# Patient Record
Sex: Female | Born: 1950 | Race: White | Hispanic: No | Marital: Single | Smoking: Former smoker
Health system: Southern US, Community
[De-identification: ages and names within clinical notes are randomized; demographics above are authoritative.]

## PROBLEM LIST (undated history)

## (undated) DIAGNOSIS — G709 Myoneural disorder, unspecified: Secondary | ICD-10-CM

## (undated) DIAGNOSIS — M810 Age-related osteoporosis without current pathological fracture: Secondary | ICD-10-CM

## (undated) DIAGNOSIS — M1990 Unspecified osteoarthritis, unspecified site: Secondary | ICD-10-CM

## (undated) DIAGNOSIS — C801 Malignant (primary) neoplasm, unspecified: Secondary | ICD-10-CM

## (undated) DIAGNOSIS — Z5189 Encounter for other specified aftercare: Secondary | ICD-10-CM

## (undated) DIAGNOSIS — T7840XA Allergy, unspecified, initial encounter: Secondary | ICD-10-CM

## (undated) HISTORY — PX: APPENDECTOMY: SHX54

## (undated) HISTORY — PX: BREAST SURGERY: SHX581

## (undated) HISTORY — DX: Unspecified osteoarthritis, unspecified site: M19.90

## (undated) HISTORY — PX: ELBOW SURGERY: SHX618

## (undated) HISTORY — DX: Age-related osteoporosis without current pathological fracture: M81.0

## (undated) HISTORY — DX: Encounter for other specified aftercare: Z51.89

## (undated) HISTORY — DX: Allergy, unspecified, initial encounter: T78.40XA

## (undated) HISTORY — PX: TONSILLECTOMY: SHX5217

## (undated) HISTORY — DX: Myoneural disorder, unspecified: G70.9

---

## 2000-09-24 ENCOUNTER — Other Ambulatory Visit: Admission: RE | Admit: 2000-09-24 | Discharge: 2000-09-24 | Payer: Self-pay | Admitting: Family Medicine

## 2004-05-21 HISTORY — PX: BUNIONECTOMY: SHX129

## 2005-05-02 ENCOUNTER — Encounter: Payer: Self-pay | Admitting: Orthopedic Surgery

## 2005-05-21 ENCOUNTER — Encounter: Payer: Self-pay | Admitting: Orthopedic Surgery

## 2005-06-21 ENCOUNTER — Encounter: Payer: Self-pay | Admitting: Orthopedic Surgery

## 2005-07-19 ENCOUNTER — Encounter: Payer: Self-pay | Admitting: Orthopedic Surgery

## 2006-03-26 ENCOUNTER — Encounter: Payer: Self-pay | Admitting: Orthopedic Surgery

## 2006-04-20 ENCOUNTER — Encounter: Payer: Self-pay | Admitting: Orthopedic Surgery

## 2006-05-30 ENCOUNTER — Encounter: Payer: Self-pay | Admitting: Orthopedic Surgery

## 2006-06-21 ENCOUNTER — Encounter: Payer: Self-pay | Admitting: Orthopedic Surgery

## 2008-02-25 ENCOUNTER — Ambulatory Visit: Payer: Self-pay | Admitting: Family Medicine

## 2008-10-19 HISTORY — PX: MASTECTOMY: SHX3

## 2009-01-20 ENCOUNTER — Ambulatory Visit: Payer: Self-pay | Admitting: Podiatry

## 2009-03-24 ENCOUNTER — Encounter: Payer: Self-pay | Admitting: Family Medicine

## 2009-06-15 ENCOUNTER — Ambulatory Visit: Payer: Self-pay

## 2009-07-27 ENCOUNTER — Ambulatory Visit: Payer: Self-pay | Admitting: General Practice

## 2009-11-19 ENCOUNTER — Emergency Department: Payer: Self-pay | Admitting: Emergency Medicine

## 2011-02-15 ENCOUNTER — Ambulatory Visit: Payer: Self-pay | Admitting: Physical Medicine and Rehabilitation

## 2011-05-17 ENCOUNTER — Encounter: Payer: Self-pay | Admitting: Specialist

## 2011-05-22 ENCOUNTER — Encounter: Payer: Self-pay | Admitting: Specialist

## 2011-06-22 ENCOUNTER — Encounter: Payer: Self-pay | Admitting: Specialist

## 2011-07-20 ENCOUNTER — Encounter: Payer: Self-pay | Admitting: Specialist

## 2012-05-21 HISTORY — PX: KNEE SURGERY: SHX244

## 2012-08-08 LAB — HM DEXA SCAN

## 2013-05-15 ENCOUNTER — Ambulatory Visit: Payer: Self-pay | Admitting: General Practice

## 2013-08-26 ENCOUNTER — Encounter: Payer: Self-pay | Admitting: General Practice

## 2014-04-05 LAB — HM PAP SMEAR: HM Pap smear: NEGATIVE

## 2014-09-03 ENCOUNTER — Ambulatory Visit
Admit: 2014-09-03 | Disposition: A | Discharge status: Home / Self Care | Payer: Self-pay | Attending: Family Medicine | Admitting: Family Medicine

## 2014-09-03 LAB — BASIC METABOLIC PANEL
BUN: 13 mg/dL (ref 4–21)
Creatinine: 0.9 mg/dL (ref 0.5–1.1)
Glucose: 91 mg/dL
POTASSIUM: 4.8 mmol/L (ref 3.4–5.3)
Sodium: 138 mmol/L (ref 137–147)

## 2014-09-03 LAB — CBC AND DIFFERENTIAL
HCT: 38 % (ref 36–46)
HEMOGLOBIN: 12.9 g/dL (ref 12.0–16.0)
Platelets: 299 10*3/uL (ref 150–399)
WBC: 5.8 10*3/mL

## 2014-09-03 LAB — HEPATIC FUNCTION PANEL
ALT: 7 U/L (ref 7–35)
AST: 16 U/L (ref 13–35)

## 2014-09-10 ENCOUNTER — Ambulatory Visit
Admit: 2014-09-10 | Disposition: A | Discharge status: Home / Self Care | Payer: Self-pay | Attending: Family Medicine | Admitting: Family Medicine

## 2014-09-11 NOTE — Op Note (Signed)
PATIENT NAME:  Catherine Arnold, Catherine Arnold MR#:  [id number] DATE OF BIRTH:  [birth year]  DATE OF PROCEDURE:  05/15/2013  PREOPERATIVE DIAGNOSIS: Internal derangement of the right knee.   POSTOPERATIVE DIAGNOSES: Tear of the anterior horn of the medial meniscus, right knee.   PROCEDURE PERFORMED: Right knee arthroscopy and partial medial meniscectomy.   SURGEON: Laurice Record. Hooten, MD  ANESTHESIA: General.   ESTIMATED BLOOD LOSS: Minimal.   TOURNIQUET TIME: Not used.   DRAINS: None.   INDICATIONS FOR SURGERY: The patient is a 64 year old female who has had approximately 1 to 2 year history of intermittent medial right knee pain. Pain was aggravated by pivoting or twisting-type activities. She denies any significant improvement despite conservative measures. 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 After discussion of the risks and benefits of surgical intervention, the patient expressed understanding of the risks and benefits and agreed with plans for surgical intervention.   PROCEDURE IN DETAIL: The patient was brought to the operating room and, after adequate general anesthesia was achieved, a tourniquet was placed on the patient's right thigh and the leg was placed in a leg holder. All bony prominences were well padded. The patient's right knee and leg were cleaned and prepped with alcohol and DuraPrep and draped in the usual sterile fashion. A "timeout" was performed as per usual protocol. The anticipated portal sites were injected with 0.25% Marcaine with epinephrine. An anterolateral portal was created and a cannula was inserted. The scope was inserted and the knee was distended with fluid using the Stryker pump. The scope was advanced down the medial gutter and into the medial compartment of the knee. Under visualization with the scope, an anteromedial portal was created and hook probe was inserted. Inspection of the medial compartment showed the articular surface to be in good condition. Mild  fraying was noted to the inner aspect of the posterior horn of the medial meniscus and this was contoured using the ArthroCare wand. There was a degenerative tear to the anterior horn of the medial meniscus. This was debrided using 4.5 mm shaver and then contoured using a 50 degree ArthroCare wand. The remaining rim of meniscus was visualized and probed and felt to be stable. The scope was then advanced into the intracondylar region. The anterior cruciate ligament was visualized and probed and felt to be stable. The scope was removed from the anterolateral portal and reinserted via the anteromedial portal so as to better visualize the lateral compartment. The articular surface was in good condition. Only mild fraying of the inner aspect of the lateral meniscus was noted and this was debrided using the 50 degree ArthroCare wand. Finally, the scope was positioned so as to visualize the patellofemoral articulation. Good patellar tracking was noted. A small chondral lesion was noted along the medial aspect of the trochlear groove and this was debrided and contoured using the 50 degree ArthroCare wand. The knee was irrigated with copious amounts of fluid and then suctioned dry. The anterolateral portal was reapproximated using #3-0 nylon. A combination of 0.25% Marcaine with epinephrine and 4 mg of morphine was injected via the scope. The scope was removed and the anteromedial portal was reapproximated using #3-0 nylon. Sterile dressing was applied followed by application of ice wrap.   The patient tolerated the procedure well. She was transported to the recovery room in stable condition. ____________________________ Laurice Record. Holley Bouche., MD jph:sb D: 05/15/2013 09:23:42 ET T: 05/15/2013 09:55:31 ET JOB#: [id number]  cc: Laurice Record. Holley Bouche., MD, <Dictator> JAMES P  Holley Bouche MD ELECTRONICALLY SIGNED 05/24/2013 15:57

## 2014-10-21 ENCOUNTER — Other Ambulatory Visit: Payer: Self-pay | Admitting: Family Medicine

## 2014-10-22 ENCOUNTER — Other Ambulatory Visit: Payer: Self-pay | Admitting: Family Medicine

## 2014-10-22 DIAGNOSIS — F419 Anxiety disorder, unspecified: Secondary | ICD-10-CM | POA: Insufficient documentation

## 2014-10-22 MED ORDER — CLONAZEPAM 1 MG PO TABS: 1.0000 mg | ORAL_TABLET | Freq: Every day | ORAL | Status: DC

## 2014-10-26 ENCOUNTER — Other Ambulatory Visit: Payer: Self-pay

## 2014-10-26 DIAGNOSIS — F419 Anxiety disorder, unspecified: Secondary | ICD-10-CM

## 2014-10-26 MED ORDER — CLONAZEPAM 1 MG PO TABS: 1.0000 mg | ORAL_TABLET | Freq: Every day | ORAL | Status: DC

## 2015-02-01 ENCOUNTER — Encounter: Payer: Self-pay | Admitting: Family Medicine

## 2015-02-01 ENCOUNTER — Ambulatory Visit (INDEPENDENT_AMBULATORY_CARE_PROVIDER_SITE_OTHER): Payer: 59 | Admitting: Family Medicine

## 2015-02-01 VITALS — BP 110/72 | HR 76 | Temp 98.9°F | Resp 16 | Wt 111.4 lb

## 2015-02-01 DIAGNOSIS — N952 Postmenopausal atrophic vaginitis: Secondary | ICD-10-CM | POA: Insufficient documentation

## 2015-02-01 DIAGNOSIS — F1011 Alcohol abuse, in remission: Secondary | ICD-10-CM | POA: Insufficient documentation

## 2015-02-01 DIAGNOSIS — N3000 Acute cystitis without hematuria: Secondary | ICD-10-CM | POA: Diagnosis not present

## 2015-02-01 DIAGNOSIS — J309 Allergic rhinitis, unspecified: Secondary | ICD-10-CM | POA: Insufficient documentation

## 2015-02-01 DIAGNOSIS — R55 Syncope and collapse: Secondary | ICD-10-CM | POA: Insufficient documentation

## 2015-02-01 DIAGNOSIS — E559 Vitamin D deficiency, unspecified: Secondary | ICD-10-CM | POA: Insufficient documentation

## 2015-02-01 DIAGNOSIS — Z853 Personal history of malignant neoplasm of breast: Secondary | ICD-10-CM | POA: Insufficient documentation

## 2015-02-01 DIAGNOSIS — M810 Age-related osteoporosis without current pathological fracture: Secondary | ICD-10-CM | POA: Insufficient documentation

## 2015-02-01 DIAGNOSIS — B001 Herpesviral vesicular dermatitis: Secondary | ICD-10-CM | POA: Insufficient documentation

## 2015-02-01 DIAGNOSIS — Z8709 Personal history of other diseases of the respiratory system: Secondary | ICD-10-CM | POA: Insufficient documentation

## 2015-02-01 DIAGNOSIS — M8580 Other specified disorders of bone density and structure, unspecified site: Secondary | ICD-10-CM | POA: Insufficient documentation

## 2015-02-01 LAB — POCT URINALYSIS DIPSTICK
Bilirubin, UA: NEGATIVE
Glucose, UA: NEGATIVE
Ketones, UA: NEGATIVE
Nitrite, UA: NEGATIVE
PH UA: 6.5
SPEC GRAV UA: 1.01
Urobilinogen, UA: 0.2

## 2015-02-01 MED ORDER — PHENAZOPYRIDINE HCL 100 MG PO TABS: 100.0000 mg | ORAL_TABLET | Freq: Three times a day (TID) | ORAL | Status: DC | PRN

## 2015-02-01 MED ORDER — NITROFURANTOIN MONOHYD MACRO 100 MG PO CAPS: 100.0000 mg | ORAL_CAPSULE | Freq: Two times a day (BID) | ORAL | Status: DC

## 2015-02-01 NOTE — Progress Notes (Signed)
Patient ID: Catherine Arnold, female   DOB: 07/28/1950, 64 y.o.   MRN: [id number]       Patient: Catherine Arnold Female    DOB: 1950-06-19   64 y.o.   MRN: [id number] Visit Date: 02/01/2015  Today's Provider: Margarita Rana, MD   Chief Complaint  Patient presents with  . Urinary Tract Infection    started on Sunday, stared with a headace and fatigue, also having some burling sensations, did feel a little dizzy last night at work" pt stated   Subjective:    Urinary Tract Infection  This is a recurrent problem. The current episode started in the past 7 days. The problem occurs every urination. The problem has been gradually worsening. The quality of the pain is described as burning. The pain is at a severity of 3/10. The pain is moderate. There has been no fever (low grade). There is no history of pyelonephritis. Associated symptoms include a discharge, frequency, hesitancy and nausea. Pertinent negatives include no chills, flank pain, hematuria, sweats, urgency or vomiting. Associated symptoms comments: wheezy. Treatments tried: ibuprofen and one day yeast infection tx.       Allergies  Allergen Reactions  . Bee Venom Anaphylaxis  . Paclitaxel Shortness Of Breath and Anaphylaxis    Other reaction(s): ANAPHYLAXIS Other reaction(s): SHORTNESS OF BREATH  . Prochlorperazine Maleate Other (See Comments)    Other reaction(s): Unknown reaction  . Almond Oil     Lost voice for 3 to 4 hours   . Oxycodone-Acetaminophen     Other reaction(s): NAUSEA  . Sulfa Antibiotics    Previous Medications   ACYCLOVIR (ZOVIRAX) 400 MG TABLET    Take by mouth.   CITALOPRAM (CELEXA) 20 MG TABLET    Take 1 and 1/2 tablets by  mouth daily   CLONAZEPAM (KLONOPIN) 1 MG TABLET    Take 1 tablet (1 mg total) by mouth at bedtime.   FLUTICASONE (FLONASE) 50 MCG/ACT NASAL SPRAY    Place into the nose.    Review of Systems  Constitutional: Negative for chills.  Gastrointestinal: Positive for nausea. Negative for  vomiting.  Genitourinary: Positive for hesitancy, frequency and vaginal discharge. Negative for urgency, hematuria and flank pain.    Social History  Substance Use Topics  . Smoking status: Former Smoker    Quit date: 01/20/1992  . Smokeless tobacco: Not on file  . Alcohol Use: No   Objective:   BP 110/72 mmHg  Pulse 76  Temp(Src) 98.9 F (37.2 C) (Oral)  Resp 16  Wt 111 lb 6.4 oz (50.531 kg)  LMP  (Approximate)  Physical Exam  Constitutional: She appears well-developed and well-nourished. No distress.  Cardiovascular: Normal rate.   Pulmonary/Chest: Breath sounds normal.  Abdominal: Soft. She exhibits no distension and no mass. There is no tenderness. There is no rebound, no guarding and no CVA tenderness.  Psychiatric: She has a normal mood and affect. Her speech is normal and behavior is normal. Judgment and thought content normal. Cognition and memory are normal.      Assessment & Plan:        1. Acute cystitis without hematuria Condition is worsening. Will start medication for better control.   Patient instructed to call back if condition worsens or does not improve.    - Urine culture - POCT urinalysis dipstick - nitrofurantoin, macrocrystal-monohydrate, (MACROBID) 100 MG capsule; Take 1 capsule (100 mg total) by mouth 2 (two) times daily.  Dispense: 10 capsule; Refill: 0 - phenazopyridine (PYRIDIUM) 100  MG tablet; Take 1 tablet (100 mg total) by mouth 3 (three) times daily as needed for pain.  Dispense: 10 tablet; Refill: 0    Margarita Rana, MD  [facility]

## 2015-02-03 ENCOUNTER — Telehealth: Payer: Self-pay | Admitting: Family Medicine

## 2015-02-03 LAB — URINE CULTURE

## 2015-02-03 MED ORDER — FLUCONAZOLE 150 MG PO TABS: 150.0000 mg | ORAL_TABLET | Freq: Once | ORAL | Status: DC

## 2015-02-03 NOTE — Telephone Encounter (Signed)
Pt called saying she is not feeling any better, actually feels worse.  No fever but extremely tired.  Still having UTI symptoms.  Would like a call back .  Call back number is  [phone]  [facility]

## 2015-02-03 NOTE — Telephone Encounter (Signed)
Spoke with Izora Gala she already has pyridium.  She would like to try Diflucan and see if that helps with the irritation.  Please send to [facility].  I advised her if that doesn't help to call us tomorrow and we will work her in.    Thanks,   -Mickel Baas

## 2015-02-03 NOTE — Telephone Encounter (Signed)
Please triaged. Can call in bladder numbing medication, and also does patient think it is yeast related. Thanks.

## 2015-02-03 NOTE — Telephone Encounter (Signed)
See note below. Please let patient know that culture came back with very mild infection and was sensitive to antibiotic. So would recommend numbing med and yeast medication or ov to recheck tomorrow. Thanks.

## 2015-02-03 NOTE — Telephone Encounter (Signed)
Sent in rx.  Thanks.

## 2015-02-04 ENCOUNTER — Telehealth: Payer: Self-pay

## 2015-02-04 ENCOUNTER — Ambulatory Visit: Payer: 59 | Admitting: Family Medicine

## 2015-02-04 ENCOUNTER — Encounter: Payer: Self-pay | Admitting: Family Medicine

## 2015-02-04 NOTE — Telephone Encounter (Signed)
Patient cancelled appointment. Feeling better.  Had syncopal episode. Has had previously.  Was dehydrated. Is feeling better.  Will call next week if not better. Is not drinking. Thanks.

## 2015-02-04 NOTE — Telephone Encounter (Signed)
Pt called because she "passed out" this morning. Denies loss of consciousness. Reports she broke out in a "cold sweat", hand became numb, had experienced N/V. EMS was called, and performed vitals, BS, and EKG, which all "were fine" per pt. EMS advised pt this is most likely dehydration. She is still feeling queasy and weak. Pt is concerned that her vaginal infection may have been misdiagnosed, or she is having adverse reaction to abx. Appointment has been made for 12:00 today. [name], CMA

## 2015-03-15 ENCOUNTER — Other Ambulatory Visit: Payer: Self-pay | Admitting: Family Medicine

## 2015-03-15 DIAGNOSIS — J302 Other seasonal allergic rhinitis: Secondary | ICD-10-CM

## 2015-03-26 ENCOUNTER — Ambulatory Visit (INDEPENDENT_AMBULATORY_CARE_PROVIDER_SITE_OTHER): Payer: 59

## 2015-03-26 DIAGNOSIS — Z23 Encounter for immunization: Secondary | ICD-10-CM

## 2015-06-18 ENCOUNTER — Other Ambulatory Visit: Payer: Self-pay | Admitting: Family Medicine

## 2015-06-18 DIAGNOSIS — B001 Herpesviral vesicular dermatitis: Secondary | ICD-10-CM

## 2015-07-25 ENCOUNTER — Telehealth: Payer: Self-pay

## 2015-07-25 ENCOUNTER — Ambulatory Visit: Payer: 59 | Admitting: Family Medicine

## 2015-07-25 NOTE — Telephone Encounter (Signed)
Pt called c/o red, itchy rash on bilateral chest, as well as left arm. Pt denies pain, pustules. States she was out playing golf, and does have sensitivity to sun secondary to radiation. Pt has appointment today at 4:00 pm. [name], CMA

## 2015-07-28 ENCOUNTER — Ambulatory Visit: Payer: 59 | Admitting: Family Medicine

## 2015-08-04 ENCOUNTER — Other Ambulatory Visit: Payer: Self-pay

## 2015-08-04 DIAGNOSIS — F419 Anxiety disorder, unspecified: Secondary | ICD-10-CM

## 2015-08-04 MED ORDER — CLONAZEPAM 1 MG PO TABS: 1.0000 mg | ORAL_TABLET | Freq: Every day | ORAL | Status: DC

## 2015-08-04 NOTE — Telephone Encounter (Signed)
Printed, please fax or call in to pharmacy. Thank you.   

## 2015-08-05 ENCOUNTER — Ambulatory Visit (INDEPENDENT_AMBULATORY_CARE_PROVIDER_SITE_OTHER): Payer: 59 | Admitting: Family Medicine

## 2015-08-05 ENCOUNTER — Encounter: Payer: Self-pay | Admitting: Family Medicine

## 2015-08-05 VITALS — BP 100/64 | HR 76 | Temp 98.2°F | Resp 16 | Wt 114.4 lb

## 2015-08-05 DIAGNOSIS — J069 Acute upper respiratory infection, unspecified: Secondary | ICD-10-CM | POA: Diagnosis not present

## 2015-08-05 LAB — POCT RAPID STREP A (OFFICE): Rapid Strep A Screen: NEGATIVE

## 2015-08-05 NOTE — Progress Notes (Signed)
Subjective:     Patient ID: Catherine Arnold, female   DOB: 06-Jul-1950, 65 y.o.   MRN: [id number]  HPI  Chief Complaint  Patient presents with  . Sore Throat    Patient comes into office today with concerns of strep exposure, patient states that multiple coworkers at her job have had strep  and she has had sore throat for the past 3 days. Patient reports pain when swallowing, swollen glands and post nasal srop. Patient has taken otc Ibuprofen and Robitussin  Also reports sinus congestion.   Review of Systems     Objective:   Physical Exam  Constitutional: She appears well-developed and well-nourished. No distress.  Ears: T.M's intact without inflammation Throat: tonsils absent, mild erythema Neck: small tender anterior cervical nodes Lungs: clear     Assessment:    1. Upper respiratory infection - POCT rapid strep A    Plan:    discussed otc medication.

## 2015-08-05 NOTE — Patient Instructions (Signed)
Discussed use of Mucinex D for congestion, Delsym for cough, and Benadryl for postnasal drainage 

## 2015-08-20 ENCOUNTER — Other Ambulatory Visit: Payer: Self-pay | Admitting: Family Medicine

## 2015-10-30 ENCOUNTER — Other Ambulatory Visit: Payer: Self-pay | Admitting: Family Medicine

## 2015-12-04 ENCOUNTER — Other Ambulatory Visit: Payer: Self-pay | Admitting: Family Medicine

## 2015-12-05 ENCOUNTER — Other Ambulatory Visit: Payer: Self-pay | Admitting: Physician Assistant

## 2015-12-05 DIAGNOSIS — F419 Anxiety disorder, unspecified: Secondary | ICD-10-CM

## 2015-12-05 MED ORDER — CLONAZEPAM 1 MG PO TABS: 1.0000 mg | ORAL_TABLET | Freq: Every day | ORAL | Status: DC

## 2015-12-05 NOTE — Progress Notes (Signed)
Please call in/fax clonazepam 1mg  tab Take one tab PO q h.s. #90 1 refill to [facility]. Thanks.

## 2015-12-05 NOTE — Progress Notes (Signed)
Prescription called in. Per pharmacist is going to put it on hold since patient just pick up her last refill today.  Thanks,  -JER

## 2015-12-27 ENCOUNTER — Other Ambulatory Visit: Payer: Self-pay

## 2015-12-27 MED ORDER — FLUCONAZOLE 150 MG PO TABS: 150.0000 mg | ORAL_TABLET | Freq: Once | ORAL | 1 refills | Status: AC

## 2015-12-27 NOTE — Telephone Encounter (Signed)
Request came in via Email.  Pt uses this for for "Yeast infections that I have frequently as result of breast cancer chemotherapy side effect.    Thanks,   -Mickel Baas

## 2016-01-02 ENCOUNTER — Other Ambulatory Visit: Payer: Self-pay | Admitting: Family Medicine

## 2016-01-26 ENCOUNTER — Other Ambulatory Visit: Payer: Self-pay | Admitting: Family Medicine

## 2016-01-26 DIAGNOSIS — F419 Anxiety disorder, unspecified: Secondary | ICD-10-CM

## 2016-02-27 ENCOUNTER — Other Ambulatory Visit: Payer: Self-pay | Admitting: Family Medicine

## 2016-02-27 DIAGNOSIS — J302 Other seasonal allergic rhinitis: Secondary | ICD-10-CM

## 2016-06-26 ENCOUNTER — Telehealth: Payer: Self-pay

## 2016-06-26 DIAGNOSIS — F419 Anxiety disorder, unspecified: Secondary | ICD-10-CM

## 2016-06-26 MED ORDER — CLONAZEPAM 1 MG PO TABS: 1.0000 mg | ORAL_TABLET | Freq: Every day | ORAL | 1 refills | Status: DC

## 2016-06-26 NOTE — Telephone Encounter (Signed)
Patient requesting refill on clonazepam  Last ov 08/05/15 Last filled 12/05/15 Please review. Thank you. sd

## 2016-06-26 NOTE — Telephone Encounter (Signed)
Rx printed to be sent to [facility]

## 2016-06-29 ENCOUNTER — Other Ambulatory Visit: Payer: Self-pay | Admitting: Physician Assistant

## 2016-06-29 DIAGNOSIS — J302 Other seasonal allergic rhinitis: Secondary | ICD-10-CM

## 2016-06-29 DIAGNOSIS — F339 Major depressive disorder, recurrent, unspecified: Secondary | ICD-10-CM

## 2016-10-12 ENCOUNTER — Other Ambulatory Visit: Payer: Self-pay | Admitting: Physician Assistant

## 2016-10-12 ENCOUNTER — Other Ambulatory Visit: Payer: Self-pay | Admitting: Family Medicine

## 2016-10-12 DIAGNOSIS — F419 Anxiety disorder, unspecified: Secondary | ICD-10-CM

## 2016-10-12 NOTE — Telephone Encounter (Signed)
[facility] faxed a request on the following medication. Thanks CC   clonazePAM (KLONOPIN) 1 MG tablet

## 2016-10-16 NOTE — Telephone Encounter (Signed)
Please review. Emily Drozdowski, CMA  

## 2016-10-17 ENCOUNTER — Telehealth: Payer: Self-pay | Admitting: Physician Assistant

## 2016-10-17 DIAGNOSIS — F419 Anxiety disorder, unspecified: Secondary | ICD-10-CM

## 2016-10-17 MED ORDER — CLONAZEPAM 1 MG PO TABS: 1.0000 mg | ORAL_TABLET | Freq: Every day | ORAL | 1 refills | Status: DC

## 2016-10-17 NOTE — Telephone Encounter (Signed)
Rx printed to be faxed to [facility]

## 2016-10-17 NOTE — Telephone Encounter (Signed)
[facility] faxed a request on the following medication. Thanks CC  clonazePAM (KLONOPIN) 1 MG tablet

## 2016-10-17 NOTE — Telephone Encounter (Signed)
Please review. Thanks!  

## 2017-03-06 ENCOUNTER — Encounter: Payer: Self-pay | Admitting: Physician Assistant

## 2017-03-07 ENCOUNTER — Encounter: Payer: Self-pay | Admitting: Family Medicine

## 2017-03-07 ENCOUNTER — Ambulatory Visit (INDEPENDENT_AMBULATORY_CARE_PROVIDER_SITE_OTHER): Payer: 59 | Admitting: Family Medicine

## 2017-03-07 VITALS — BP 98/54 | HR 72 | Temp 97.5°F | Resp 16 | Ht 66.0 in | Wt 111.0 lb

## 2017-03-07 DIAGNOSIS — F419 Anxiety disorder, unspecified: Secondary | ICD-10-CM

## 2017-03-07 DIAGNOSIS — Z114 Encounter for screening for human immunodeficiency virus [HIV]: Secondary | ICD-10-CM

## 2017-03-07 DIAGNOSIS — Z1159 Encounter for screening for other viral diseases: Secondary | ICD-10-CM

## 2017-03-07 DIAGNOSIS — Z0000 Encounter for general adult medical examination without abnormal findings: Secondary | ICD-10-CM

## 2017-03-07 DIAGNOSIS — M8580 Other specified disorders of bone density and structure, unspecified site: Secondary | ICD-10-CM

## 2017-03-07 DIAGNOSIS — Z853 Personal history of malignant neoplasm of breast: Secondary | ICD-10-CM

## 2017-03-07 DIAGNOSIS — E559 Vitamin D deficiency, unspecified: Secondary | ICD-10-CM

## 2017-03-07 DIAGNOSIS — Z23 Encounter for immunization: Secondary | ICD-10-CM

## 2017-03-07 DIAGNOSIS — Z1211 Encounter for screening for malignant neoplasm of colon: Secondary | ICD-10-CM | POA: Diagnosis not present

## 2017-03-07 NOTE — Assessment & Plan Note (Signed)
Recheck vitamin D level Advice daily calcium and vitamin D supplementation for osteopenia

## 2017-03-07 NOTE — Assessment & Plan Note (Signed)
Stable on celexa 30mg  daily and klonopin qhs prn Could consider remeron if desires weight gain and improvement in appetite

## 2017-03-07 NOTE — Assessment & Plan Note (Signed)
Recheck DEXA scan Advised daily calcium and vitamin D supplementation Given history of chemotherapy and endocrine therapy for breast cancer, would consider endocrine referral for treatment options if this has progressed

## 2017-03-07 NOTE — Patient Instructions (Signed)
Preventive Care 65 Years and Older, Female Preventive care refers to lifestyle choices and visits with your health care provider that can promote health and wellness. What does preventive care include?  A yearly physical exam. This is also called an annual well check.  Dental exams once or twice a year.  Routine eye exams. Ask your health care provider how often you should have your eyes checked.  Personal lifestyle choices, including: ? Daily care of your teeth and gums. ? Regular physical activity. ? Eating a healthy diet. ? Avoiding tobacco and drug use. ? Limiting alcohol use. ? Practicing safe sex. ? Taking low-dose aspirin every day. ? Taking vitamin and mineral supplements as recommended by your health care provider. What happens during an annual well check? The services and screenings done by your health care provider during your annual well check will depend on your age, overall health, lifestyle risk factors, and family history of disease. Counseling Your health care provider may ask you questions about your:  Alcohol use.  Tobacco use.  Drug use.  Emotional well-being.  Home and relationship well-being.  Sexual activity.  Eating habits.  History of falls.  Memory and ability to understand (cognition).  Work and work environment.  Reproductive health.  Screening You may have the following tests or measurements:  Height, weight, and BMI.  Blood pressure.  Lipid and cholesterol levels. These may be checked every 5 years, or more frequently if you are over 50 years old.  Skin check.  Lung cancer screening. You may have this screening every year starting at age 55 if you have a 30-pack-year history of smoking and currently smoke or have quit within the past 15 years.  Fecal occult blood test (FOBT) of the stool. You may have this test every year starting at age 50.  Flexible sigmoidoscopy or colonoscopy. You may have a sigmoidoscopy every 5 years or  a colonoscopy every 10 years starting at age 50.  Hepatitis C blood test.  Hepatitis B blood test.  Sexually transmitted disease (STD) testing.  Diabetes screening. This is done by checking your blood sugar (glucose) after you have not eaten for a while (fasting). You may have this done every 1-3 years.  Bone density scan. This is done to screen for osteoporosis. You may have this done starting at age 65.  Mammogram. This may be done every 1-2 years. Talk to your health care provider about how often you should have regular mammograms.  Talk with your health care provider about your test results, treatment options, and if necessary, the need for more tests. Vaccines Your health care provider may recommend certain vaccines, such as:  Influenza vaccine. This is recommended every year.  Tetanus, diphtheria, and acellular pertussis (Tdap, Td) vaccine. You may need a Td booster every 10 years.  Varicella vaccine. You may need this if you have not been vaccinated.  Zoster vaccine. You may need this after age 60.  Measles, mumps, and rubella (MMR) vaccine. You may need at least one dose of MMR if you were born in 1957 or later. You may also need a second dose.  Pneumococcal 13-valent conjugate (PCV13) vaccine. One dose is recommended after age 65.  Pneumococcal polysaccharide (PPSV23) vaccine. One dose is recommended after age 65.  Meningococcal vaccine. You may need this if you have certain conditions.  Hepatitis A vaccine. You may need this if you have certain conditions or if you travel or work in places where you may be exposed to hepatitis   A.  Hepatitis B vaccine. You may need this if you have certain conditions or if you travel or work in places where you may be exposed to hepatitis B.  Haemophilus influenzae type b (Hib) vaccine. You may need this if you have certain conditions.  Talk to your health care provider about which screenings and vaccines you need and how often you  need them. This information is not intended to replace advice given to you by your health care provider. Make sure you discuss any questions you have with your health care provider. Document Released: 06/03/2015 Document Revised: 01/25/2016 Document Reviewed: 03/08/2015 Elsevier Interactive Patient Education  2017 [employer].

## 2017-03-07 NOTE — Progress Notes (Signed)
Patient: Catherine Arnold, Female    DOB: [birth year], 66 y.o.   MRN: [id number] Visit Date: 03/07/2017  Today's Provider: Lavon Paganini, MD   Chief Complaint  Patient presents with  . Annual Exam   Subjective:    Annual physical exam Catherine Arnold is a 66 y.o. female who presents today for health maintenance and complete physical. She feels fairly well. She reports exercising 3 days a week. Plays golf. She reports she is sleeping well.  Healthy diet but doesn't eat enough.  States that after losing 40lbs with cancer, hasn't been able to regain.  Last mammogram- S/P bilateral mastectomy due to breast cancer in 2010 s/p chemo and radiation followed by 30yrs of endocrine therapy with Femara followed by Tamoxifen. Now off of all medications. - last appt with oncology in 05/2014 was supposed to f/u in 1 yr Last BMD- 08/08/2012- osteopenia.  Not taking any Ca or Vit D  Pt has never had a colonoscopy, and refuses to update this. She does agree to [employer]. -----------------------------------------------------------------   Review of Systems  Constitutional: Negative.   HENT: Negative.   Eyes: Negative.   Respiratory: Negative.   Cardiovascular: Negative.   Gastrointestinal: Negative.   Endocrine: Negative.   Genitourinary: Negative.   Musculoskeletal: Negative.   Skin: Negative.   Allergic/Immunologic: Positive for environmental allergies. Negative for food allergies and immunocompromised state.  Neurological: Negative.   Hematological: Negative.   Psychiatric/Behavioral: Negative.     Social History      She  reports that she quit smoking about 25 years ago. She has never used smokeless tobacco. She reports that she drinks about 4.2 oz of alcohol per week . She reports that she does not use drugs.       Social History   Social History  . Marital status: Married    Spouse name: N/A  . Number of children: N/A  . Years of education: N/A   Social History Main  Topics  . Smoking status: Former Smoker    Quit date: 01/20/1992  . Smokeless tobacco: Never Used  . Alcohol use 4.2 oz/week    7 Cans of beer per week  . Drug use: No  . Sexual activity: Not Asked   Other Topics Concern  . None   Social History Narrative  . None    History reviewed. No pertinent past medical history.   Patient Active Problem List   Diagnosis Date Noted  . Allergic rhinitis 02/01/2015  . H/O malignant neoplasm of female breast 02/01/2015  . 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 H/O alcohol abuse 02/01/2015  . Osteopenia 02/01/2015  . 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 Avitaminosis D 02/01/2015  . Anxiety 10/22/2014    Past Surgical History:  Procedure Laterality Date  . APPENDECTOMY    . BUNIONECTOMY  2006  . ELBOW SURGERY Bilateral   . KNEE SURGERY Bilateral 2014   meniscus tear  . MASTECTOMY Bilateral 10/19/2008  . TONSILLECTOMY      Family History        Family Status  Relation Status  . Mother Deceased at age 70  . Father Deceased at age 31       MVA  . Sister Alive  . Brother Alive        Her family history includes Dementia in her mother; Seizures in her sister.     Allergies  Allergen Reactions  . Bee Venom Anaphylaxis  . Paclitaxel Shortness Of Breath and Anaphylaxis  Other reaction(s): ANAPHYLAXIS Other reaction(s): SHORTNESS OF BREATH  . Prochlorperazine Maleate Other (See Comments)    Other reaction(s): Unknown reaction  . Oxycodone-Acetaminophen     Other reaction(s): NAUSEA  . Sulfa Antibiotics      Current Outpatient Prescriptions:  .  acyclovir (ZOVIRAX) 400 MG tablet, Take 1 tablet by mouth 3  times a day for 5 days as  needed, Disp: 90 tablet, Rfl: 1 .  citalopram (CELEXA) 20 MG tablet, TAKE 1 AND 1/2 TABLETS BY  MOUTH DAILY, Disp: 135 tablet, Rfl: 3 .  clonazePAM (KLONOPIN) 1 MG tablet, Take 1 tablet (1 mg total) by mouth at bedtime., Disp: 90 tablet, Rfl: 1 .  fluticasone (FLONASE) 50 MCG/ACT nasal spray, USE 2 SPRAYS IN  EACH  NOSTRIL DAILY, Disp: 48 g, Rfl: 3   Patient Care Team: Mar Daring, PA-C as PCP - General (Family Medicine)      Objective:   Vitals: BP (!) 98/54 (BP Location: Left Arm, Patient Position: Sitting, Cuff Size: Normal)   Pulse 72   Temp (!) 97.5 F (36.4 C) (Oral)   Resp 16   Ht 5\' 6"  (1.676 m)   Wt 111 lb (50.3 kg)   BMI 17.92 kg/m    Vitals:   03/07/17 1408  BP: (!) 98/54  Pulse: 72  Resp: 16  Temp: (!) 97.5 F (36.4 C)  TempSrc: Oral  Weight: 111 lb (50.3 kg)  Height: 5\' 6"  (1.676 m)     Physical Exam  Constitutional: She is oriented to person, place, and time. She appears well-developed and well-nourished. No distress.  HENT:  Head: Normocephalic and atraumatic.  Right Ear: Tympanic membrane, external ear and ear canal normal.  Left Ear: Tympanic membrane, external ear and ear canal normal.  Nose: Nose normal.  Mouth/Throat: Oropharynx is clear and moist.  Eyes: Pupils are equal, round, and reactive to light. Conjunctivae are normal. No scleral icterus.  Neck: Neck supple. No thyromegaly present.  Cardiovascular: Normal rate, regular rhythm, normal heart sounds and intact distal pulses.   No murmur heard. Pulmonary/Chest: Effort normal and breath sounds normal. No respiratory distress. She has no wheezes. She has no rales.  Breasts: b/l post-mastectomy site well healed and free of suspicious changes, surgical scars noted, no axillary LAD.  Abdominal: Soft. Bowel sounds are normal. She exhibits no distension. There is no tenderness. There is no rebound and no guarding.  Musculoskeletal: She exhibits no edema or deformity.  Lymphadenopathy:    She has no cervical adenopathy.  Neurological: She is alert and oriented to person, place, and time. No cranial nerve deficit.  Skin: Skin is warm and dry. No rash noted.  Psychiatric: She has a normal mood and affect. Her behavior is normal.  Vitals reviewed.    Depression Screen PHQ 2/9 Scores 03/07/2017    PHQ - 2 Score 0    Assessment & Plan:     Routine Health Maintenance and Physical Exam  Exercise Activities and Dietary recommendations Goals    None      Immunization History  Administered Date(s) Administered  . Influenza, High Dose Seasonal PF 03/07/2017  . Influenza,inj,Quad PF,6+ Mos 03/26/2015  . Influenza-Unspecified 02/09/2016  . Pneumococcal Conjugate-13 03/07/2017  . Zoster 02/27/2012    Health Maintenance  Topic Date Due  . Hepatitis C Screening  April 24, 1951  . HIV Screening  09/10/1966  . TETANUS/TDAP  09/10/1970  . Fecal DNA (Cologuard)  09/09/2001  . PNA vac Low Risk Adult (1 of 2 -  PCV13) 09/09/2016  . INFLUENZA VACCINE  12/19/2016  . PAP SMEAR  04/05/2017  . DEXA SCAN  Completed     Discussed health benefits of physical activity, and encouraged her to engage in regular exercise appropriate for her age and condition.  - Flu shot and Pneumovax given today -ColoGuard ordered for colon cancer screening - Patient requests a shingles vaccination when it is back in stock - Screening labs today  Problem List Items Addressed This Visit      Musculoskeletal and Integument   Osteopenia    Recheck DEXA scan Advised daily calcium and vitamin D supplementation Given history of chemotherapy and endocrine therapy for breast cancer, would consider endocrine referral for treatment options if this has progressed      Relevant Orders   DG Bone Density     Other   Anxiety    Stable on celexa 30mg  daily and klonopin qhs prn Could consider remeron if desires weight gain and improvement in appetite      H/O malignant neoplasm of female breast   Avitaminosis D    Recheck vitamin D level Advice daily calcium and vitamin D supplementation for osteopenia      Relevant Orders   VITAMIN D 25 Hydroxy (Vit-D Deficiency, Fractures)    Other Visit Diagnoses    Healthcare maintenance    -  Primary   Relevant Orders   Lipid panel   CBC w/Diff/Platelet    Comprehensive metabolic panel   Encounter for screening for HIV       Relevant Orders   HIV antibody (with reflex)   Need for hepatitis C screening test       Relevant Orders   Hepatitis C Antibody   Colon cancer screening       Relevant Orders   Cologuard   Flu vaccine need       Relevant Orders   Flu vaccine HIGH DOSE PF (Completed)   Need for pneumococcal vaccination       Relevant Orders   Pneumococcal conjugate vaccine 13-valent IM (Completed)      Return in about 1 year (around 03/07/2018) for CPE.   -------------------------------------------------------------------- The entirety of the information documented in the History of Present Illness, Review of Systems and Physical Exam were personally obtained by me. Portions of this information were initially documented by Raquel Sarna Ratchford, CMA and reviewed by me for thoroughness and accuracy.     Lavon Paganini, MD  [facility]

## 2017-03-08 LAB — COMPREHENSIVE METABOLIC PANEL
ALT: 7 IU/L (ref 0–32)
AST: 16 IU/L (ref 0–40)
Albumin/Globulin Ratio: 1.8 (ref 1.2–2.2)
Albumin: 4.7 g/dL (ref 3.6–4.8)
Alkaline Phosphatase: 41 IU/L (ref 39–117)
BUN/Creatinine Ratio: 14 (ref 12–28)
BUN: 12 mg/dL (ref 8–27)
Bilirubin Total: 0.2 mg/dL (ref 0.0–1.2)
CO2: 25 mmol/L (ref 20–29)
Calcium: 9.5 mg/dL (ref 8.7–10.3)
Chloride: 98 mmol/L (ref 96–106)
Creatinine, Ser: 0.88 mg/dL (ref 0.57–1.00)
GFR calc Af Amer: 80 mL/min/{1.73_m2} (ref >59)
GFR calc non Af Amer: 69 mL/min/{1.73_m2} (ref >59)
Globulin, Total: 2.6 g/dL (ref 1.5–4.5)
Glucose: 104 mg/dL — ABNORMAL HIGH (ref 65–99)
Potassium: 4 mmol/L (ref 3.5–5.2)
Sodium: 135 mmol/L (ref 134–144)
Total Protein: 7.3 g/dL (ref 6.0–8.5)

## 2017-03-08 LAB — CBC WITH DIFFERENTIAL/PLATELET
BASOS ABS: 0 10*3/uL (ref 0.0–0.2)
Basos: 1 %
EOS (ABSOLUTE): 0.1 10*3/uL (ref 0.0–0.4)
Eos: 1 %
Hematocrit: 39.5 % (ref 34.0–46.6)
Hemoglobin: 13.3 g/dL (ref 11.1–15.9)
IMMATURE GRANS (ABS): 0 10*3/uL (ref 0.0–0.1)
IMMATURE GRANULOCYTES: 0 %
LYMPHS: 31 %
Lymphocytes Absolute: 1.7 10*3/uL (ref 0.7–3.1)
MCH: 31.1 pg (ref 26.6–33.0)
MCHC: 33.7 g/dL (ref 31.5–35.7)
MCV: 93 fL (ref 79–97)
Monocytes Absolute: 0.5 10*3/uL (ref 0.1–0.9)
Monocytes: 9 %
NEUTROS PCT: 58 %
Neutrophils Absolute: 3.2 10*3/uL (ref 1.4–7.0)
PLATELETS: 328 10*3/uL (ref 150–379)
RBC: 4.27 x10E6/uL (ref 3.77–5.28)
RDW: 13.4 % (ref 12.3–15.4)
WBC: 5.5 10*3/uL (ref 3.4–10.8)

## 2017-03-08 LAB — LIPID PANEL
CHOLESTEROL TOTAL: 195 mg/dL (ref 100–199)
Chol/HDL Ratio: 2.3 ratio (ref 0.0–4.4)
HDL: 85 mg/dL (ref >39)
LDL Calculated: 85 mg/dL (ref 0–99)
Triglycerides: 127 mg/dL (ref 0–149)
VLDL Cholesterol Cal: 25 mg/dL (ref 5–40)

## 2017-03-08 LAB — HEPATITIS C ANTIBODY

## 2017-03-08 LAB — HIV ANTIBODY (ROUTINE TESTING W REFLEX): HIV SCREEN 4TH GENERATION: NONREACTIVE

## 2017-03-08 LAB — VITAMIN D 25 HYDROXY (VIT D DEFICIENCY, FRACTURES): Vit D, 25-Hydroxy: 32.2 ng/mL (ref 30.0–100.0)

## 2017-03-28 ENCOUNTER — Encounter: Payer: Self-pay | Admitting: Family Medicine

## 2017-04-01 ENCOUNTER — Encounter: Payer: Self-pay | Admitting: Family Medicine

## 2017-04-01 ENCOUNTER — Ambulatory Visit (INDEPENDENT_AMBULATORY_CARE_PROVIDER_SITE_OTHER): Payer: 59 | Admitting: Family Medicine

## 2017-04-01 VITALS — BP 110/80 | HR 76 | Temp 97.7°F | Resp 15 | Wt 113.6 lb

## 2017-04-01 DIAGNOSIS — J069 Acute upper respiratory infection, unspecified: Secondary | ICD-10-CM | POA: Diagnosis not present

## 2017-04-01 NOTE — Progress Notes (Signed)
Subjective:     Patient ID: Catherine Arnold, female   DOB: [birth year], 66 y.o.   MRN: [id number]  HPI  Chief Complaint  Patient presents with  . Cough    Patient comes in office today with complaints of cough, congestion and sore throat since 03/24/17. Patient reports painful cough and production of yellow phlegm, patient states that she has been running a fever high of 100. Patient has been taking otc Robitussin.   States sinus drainage is clear and cough is congested but minimally productive of purulent sputum.   Review of Systems     Objective:   Physical Exam  Constitutional: She appears well-developed and well-nourished. No distress.  Ears: T.M's intact without inflammation Throat: tonsils absent Neck: no cervical adenopathy Lungs: clear     Assessment:    1. Viral upper respiratory tract infection    Plan:    Discussed sx treatment with Mucinex and cough suppressants. Will call if not improving over the course of the week.

## 2017-04-01 NOTE — Patient Instructions (Signed)
Discussed use of Mucinex for expectorant and Delsym for cough. Try the hydrocodone at home for night time cough. Let me know if not improving over the course of week.

## 2017-04-05 LAB — COLOGUARD: Cologuard: NEGATIVE

## 2017-04-06 ENCOUNTER — Encounter: Payer: Self-pay | Admitting: Family Medicine

## 2017-04-06 ENCOUNTER — Ambulatory Visit
Admission: RE | Admit: 2017-04-06 | Discharge: 2017-04-06 | Disposition: A | Discharge status: Home / Self Care | Payer: 59 | Source: Ambulatory Visit | Attending: Family Medicine | Admitting: Family Medicine

## 2017-04-06 ENCOUNTER — Ambulatory Visit: Payer: 59 | Admitting: Physician Assistant

## 2017-04-06 ENCOUNTER — Ambulatory Visit (INDEPENDENT_AMBULATORY_CARE_PROVIDER_SITE_OTHER): Payer: 59 | Admitting: Family Medicine

## 2017-04-06 ENCOUNTER — Telehealth: Payer: Self-pay

## 2017-04-06 DIAGNOSIS — R918 Other nonspecific abnormal finding of lung field: Secondary | ICD-10-CM | POA: Insufficient documentation

## 2017-04-06 DIAGNOSIS — J181 Lobar pneumonia, unspecified organism: Principal | ICD-10-CM

## 2017-04-06 DIAGNOSIS — R05 Cough: Secondary | ICD-10-CM | POA: Diagnosis not present

## 2017-04-06 DIAGNOSIS — R509 Fever, unspecified: Secondary | ICD-10-CM | POA: Insufficient documentation

## 2017-04-06 DIAGNOSIS — J189 Pneumonia, unspecified organism: Secondary | ICD-10-CM

## 2017-04-06 IMAGING — CR DG CHEST 2V
1 series · 2 of 2 positions shown · non-contrast
Comparison: [DATE]

CLINICAL DATA: Cough.  Shortness of breath and right chest pain.

EXAM:
CHEST  2 VIEW

[Series 1: w chest pa · 0.14mm/px · 2 of 2 slices shown]
[im 1/2]
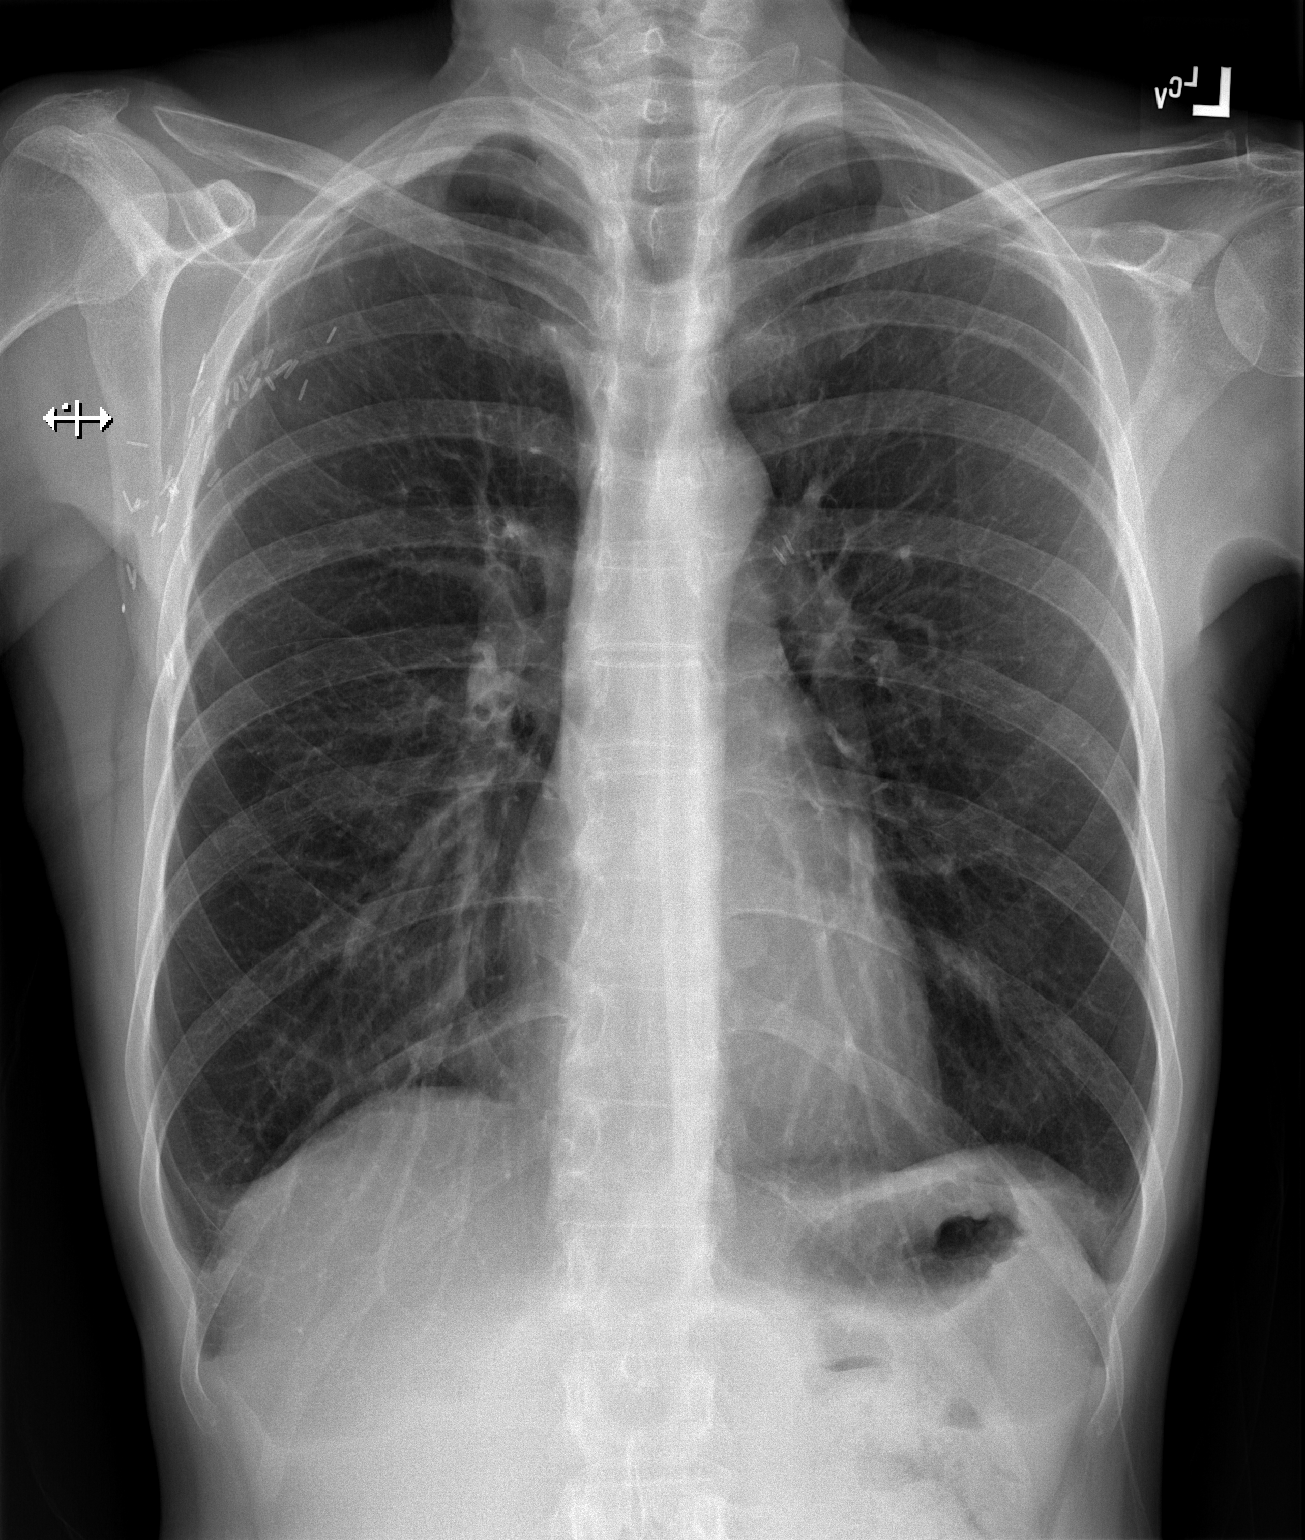
[im 2/2]
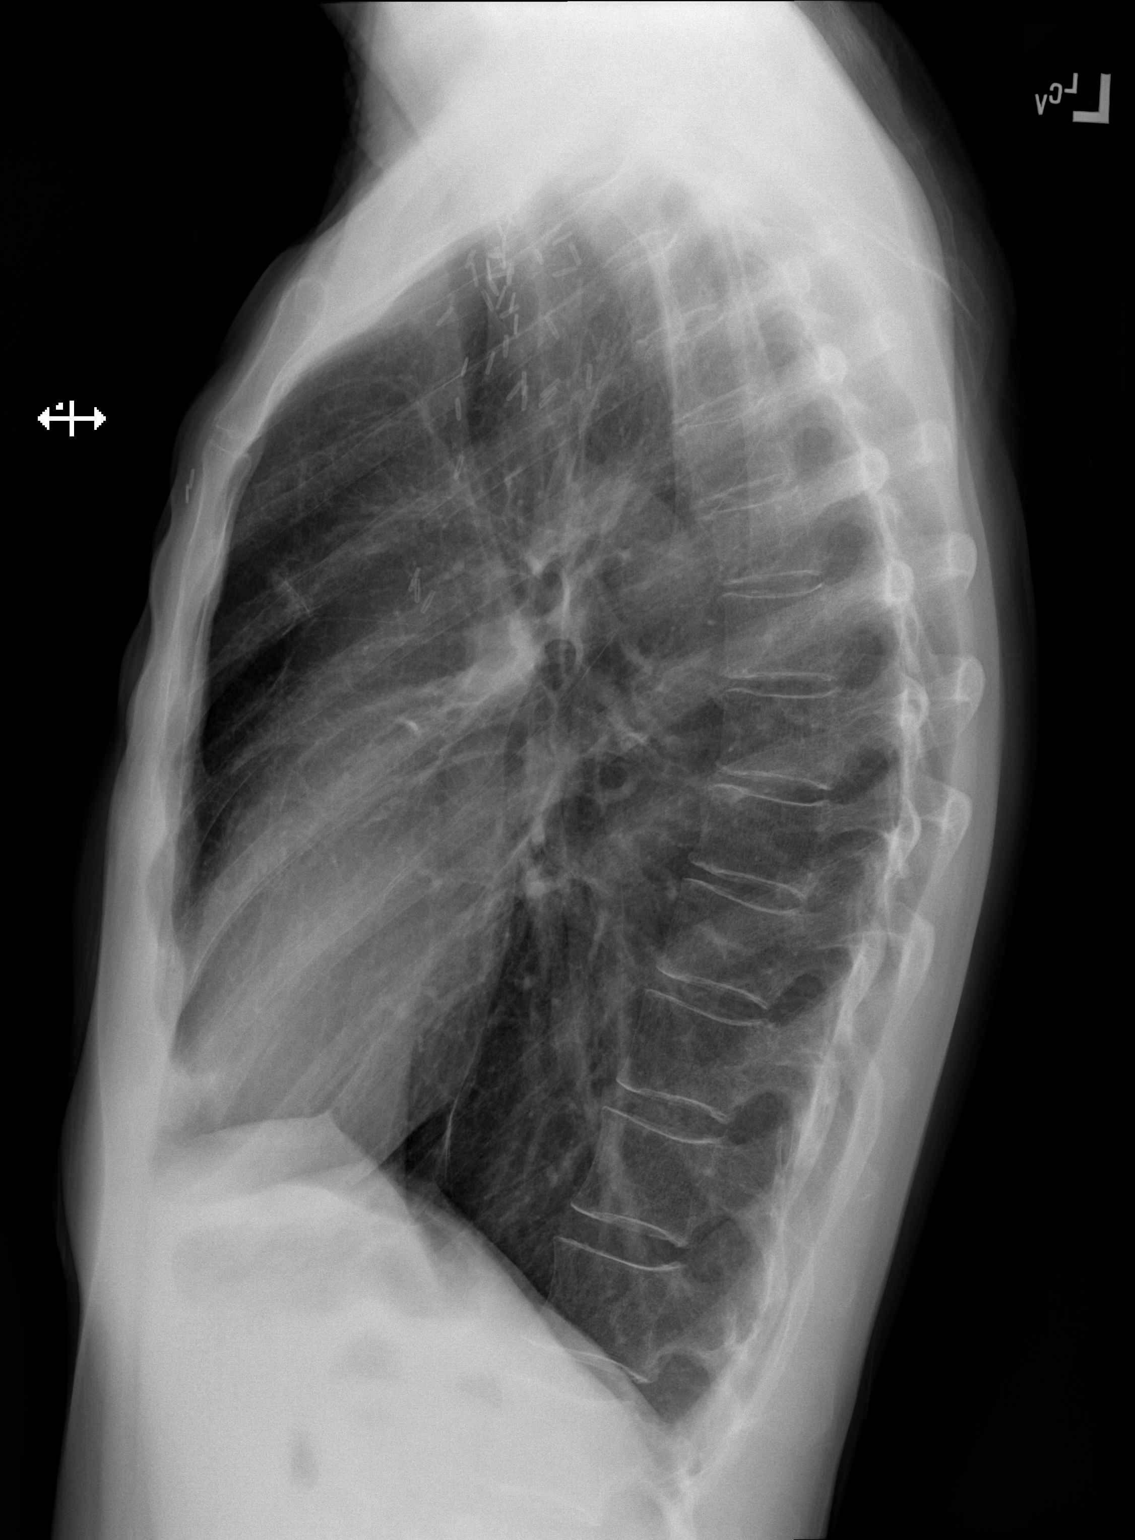

[2 of 2 positions shown; findings below may reference images not displayed]

FINDINGS: Surgical clips in the right axilla. Hyperinflation of the lungs,
stable. The heart, hila, and mediastinum are normal. No nodules or
masses. No focal infiltrates. No other acute abnormalities.
IMPRESSION: Hyperinflation of the lungs.  No other acute abnormalities.

## 2017-04-06 MED ORDER — DOXYCYCLINE HYCLATE 100 MG PO TABS: 100.0000 mg | ORAL_TABLET | Freq: Two times a day (BID) | ORAL | 0 refills | Status: DC

## 2017-04-06 NOTE — Telephone Encounter (Signed)
-----   Message from Virginia Crews, MD sent at 04/06/2017 10:49 AM EST ----- No pneumonia seen on chest XRay.  Would still finish antibiotic course.  Virginia Crews, MD, MPH [facility] 04/06/2017 10:49 AM

## 2017-04-06 NOTE — Patient Instructions (Signed)

## 2017-04-06 NOTE — Progress Notes (Signed)
Patient: Catherine Arnold Female    DOB: [birth year]   66 y.o.   MRN: [id number] Visit Date: 04/06/2017  Today's Provider: Lavon Paganini, MD   Chief Complaint  Patient presents with  . URI    Started November 1st    Subjective:    URI   This is a new problem. The current episode started 1 to 4 weeks ago. The problem has been gradually worsening. Associated symptoms include coughing and wheezing. Pertinent negatives include no abdominal pain, chest pain, congestion, diarrhea, dysuria, ear pain, headaches, joint pain, joint swelling, nausea, neck pain, plugged ear sensation, rash, rhinorrhea, sinus pain, sneezing, sore throat, swollen glands or vomiting.    Seen by PA 5 days ago.  Diagnosed with viral URI.  Symptoms ongoing seen 10/31.  Has been using Robitussin, Mucinex, Delsum with no improvement.  Fevers have subsided since 11/12 visit.  Cough keeps worsening.  Now with R back pain with deep inspiration and SOB    Allergies  Allergen Reactions  . Bee Venom Anaphylaxis  . Paclitaxel Shortness Of Breath and Anaphylaxis    Other reaction(s): ANAPHYLAXIS Other reaction(s): SHORTNESS OF BREATH  . Prochlorperazine Maleate Other (See Comments)    Other reaction(s): Unknown reaction  . Oxycodone-Acetaminophen     Other reaction(s): NAUSEA  . Sulfa Antibiotics      Current Outpatient Medications:  .  acyclovir (ZOVIRAX) 400 MG tablet, Take 1 tablet by mouth 3  times a day for 5 days as  needed, Disp: 90 tablet, Rfl: 1 .  citalopram (CELEXA) 20 MG tablet, TAKE 1 AND 1/2 TABLETS BY  MOUTH DAILY, Disp: 135 tablet, Rfl: 3 .  clonazePAM (KLONOPIN) 1 MG tablet, Take 1 tablet (1 mg total) by mouth at bedtime., Disp: 90 tablet, Rfl: 1 .  fluticasone (FLONASE) 50 MCG/ACT nasal spray, USE 2 SPRAYS IN EACH  NOSTRIL DAILY, Disp: 48 g, Rfl: 3  Review of Systems  Constitutional: Positive for fatigue. Negative for activity change, appetite change, chills, diaphoresis, fever and unexpected  weight change.  HENT: Negative.  Negative for congestion, ear pain, rhinorrhea, sinus pain, sneezing and sore throat.   Eyes: Negative.   Respiratory: Positive for cough, chest tightness, shortness of breath and wheezing. Negative for apnea and choking.   Cardiovascular: Negative for chest pain.  Gastrointestinal: Negative.  Negative for abdominal pain, diarrhea, nausea and vomiting.  Genitourinary: Negative for dysuria.  Musculoskeletal: Negative for joint pain and neck pain.  Skin: Negative for rash.  Neurological: Positive for light-headedness. Negative for dizziness and headaches.    Social History   Tobacco Use  . Smoking status: Former Smoker    Last attempt to quit: 01/20/1992    Years since quitting: 25.2  . Smokeless tobacco: Never Used  Substance Use Topics  . Alcohol use: Yes    Alcohol/week: 4.2 oz    Types: 7 Cans of beer per week   Objective:   BP 116/74 (BP Location: Right Arm, Patient Position: Sitting, Cuff Size: Normal)   Pulse 84   Temp (!) 97.4 F (36.3 C) (Oral)   Resp 16   Wt 113 lb (51.3 kg)   BMI 18.24 kg/m  Vitals:   04/06/17 0908  BP: 116/74  Pulse: 84  Resp: 16  Temp: (!) 97.4 F (36.3 C)  TempSrc: Oral  Weight: 113 lb (51.3 kg)     Physical Exam  Constitutional: She is oriented to person, place, and time. She appears well-developed and well-nourished. No  distress.  Intermittent coughing spells that double her over in the chair  HENT:  Head: Normocephalic and atraumatic.  Right Ear: External ear normal.  Left Ear: External ear normal.  Nose: Nose normal.  Mouth/Throat: Oropharynx is clear and moist.  Eyes: Conjunctivae are normal. No scleral icterus.  Neck: Neck supple. No thyromegaly present.  Cardiovascular: Normal rate, regular rhythm, normal heart sounds and intact distal pulses.  No murmur heard. Pulmonary/Chest: Effort normal. No respiratory distress. She has no wheezes.  Crackles and rhonchorus breath sounds in R base    Musculoskeletal: She exhibits no edema.  Lymphadenopathy:    She has no cervical adenopathy.  Neurological: She is alert and oriented to person, place, and time.  Skin: Skin is warm and dry. No rash noted.  Psychiatric: She has a normal mood and affect. Her behavior is normal.  Vitals reviewed.       Assessment & Plan:      Problem List Items Addressed This Visit      Respiratory   CAP (community acquired pneumonia)    Concern for CAP given focal lung exam, productive cough, and duration of illness Will obtain CXR for diagnosis Will empirically start treatment with Doxycycline Discussed return precautions including worsening SOB, CP, fevers May continue to use OTC cough syrup F/u in 1 week to ensure improvement      Relevant Medications   doxycycline (VIBRA-TABS) 100 MG tablet   Other Relevant Orders   DG Chest 2 View      Return in about 1 week (around 04/13/2017).     The entirety of the information documented in the History of Present Illness, Review of Systems and Physical Exam were personally obtained by me. Portions of this information were initially documented by Ashley Royalty, CMA and reviewed by me for thoroughness and accuracy.     Lavon Paganini, MD  [facility]

## 2017-04-06 NOTE — Assessment & Plan Note (Signed)
Concern for CAP given focal lung exam, productive cough, and duration of illness Will obtain CXR for diagnosis Will empirically start treatment with Doxycycline Discussed return precautions including worsening SOB, CP, fevers May continue to use OTC cough syrup F/u in 1 week to ensure improvement

## 2017-04-06 NOTE — Telephone Encounter (Signed)
Pt advised.   Thanks,   -Rojelio Uhrich  

## 2017-04-08 ENCOUNTER — Encounter: Payer: Self-pay | Admitting: Family Medicine

## 2017-04-08 ENCOUNTER — Emergency Department: Payer: 59

## 2017-04-08 ENCOUNTER — Encounter: Payer: Self-pay | Admitting: Emergency Medicine

## 2017-04-08 ENCOUNTER — Emergency Department
Admission: EM | Admit: 2017-04-08 | Discharge: 2017-04-08 | Disposition: A | Discharge status: Home / Self Care | Payer: 59 | Attending: Emergency Medicine | Admitting: Emergency Medicine

## 2017-04-08 ENCOUNTER — Other Ambulatory Visit: Payer: Self-pay

## 2017-04-08 ENCOUNTER — Ambulatory Visit: Payer: 59 | Admitting: Family Medicine

## 2017-04-08 DIAGNOSIS — R0789 Other chest pain: Secondary | ICD-10-CM | POA: Diagnosis not present

## 2017-04-08 DIAGNOSIS — Z87891 Personal history of nicotine dependence: Secondary | ICD-10-CM | POA: Diagnosis not present

## 2017-04-08 DIAGNOSIS — R05 Cough: Secondary | ICD-10-CM | POA: Diagnosis not present

## 2017-04-08 DIAGNOSIS — R091 Pleurisy: Secondary | ICD-10-CM | POA: Diagnosis not present

## 2017-04-08 DIAGNOSIS — R059 Cough, unspecified: Secondary | ICD-10-CM

## 2017-04-08 DIAGNOSIS — Z79899 Other long term (current) drug therapy: Secondary | ICD-10-CM | POA: Diagnosis not present

## 2017-04-08 HISTORY — DX: Malignant (primary) neoplasm, unspecified: C80.1

## 2017-04-08 LAB — BASIC METABOLIC PANEL
Anion gap: 12 (ref 5–15)
BUN: 9 mg/dL (ref 6–20)
CO2: 23 mmol/L (ref 22–32)
Calcium: 9.8 mg/dL (ref 8.9–10.3)
Chloride: 98 mmol/L — ABNORMAL LOW (ref 101–111)
Creatinine, Ser: 0.86 mg/dL (ref 0.44–1.00)
GFR calc Af Amer: >60 mL/min (ref >60)
GFR calc non Af Amer: >60 mL/min (ref >60)
Glucose, Bld: 100 mg/dL — ABNORMAL HIGH (ref 65–99)
POTASSIUM: 4.7 mmol/L (ref 3.5–5.1)
Sodium: 133 mmol/L — ABNORMAL LOW (ref 135–145)

## 2017-04-08 LAB — CBC
HEMATOCRIT: 41.9 % (ref 35.0–47.0)
Hemoglobin: 14.5 g/dL (ref 12.0–16.0)
MCH: 31.7 pg (ref 26.0–34.0)
MCHC: 34.6 g/dL (ref 32.0–36.0)
MCV: 91.8 fL (ref 80.0–100.0)
Platelets: 315 10*3/uL (ref 150–440)
RBC: 4.57 MIL/uL (ref 3.80–5.20)
RDW: 12.9 % (ref 11.5–14.5)
WBC: 8.2 10*3/uL (ref 3.6–11.0)

## 2017-04-08 LAB — TROPONIN I: Troponin I: <0.03 ng/mL (ref <0.03)

## 2017-04-08 MED ORDER — KETOROLAC TROMETHAMINE 30 MG/ML IJ SOLN
30.0000 mg | Freq: Once | INTRAMUSCULAR | Status: AC
  Administered 2017-04-08: 30 mg via INTRAVENOUS

## 2017-04-08 MED ORDER — SODIUM CHLORIDE 0.9 % IV BOLUS (SEPSIS)
1000.0000 mL | Freq: Once | INTRAVENOUS | Status: AC
  Administered 2017-04-08: 1000 mL via INTRAVENOUS

## 2017-04-08 MED ORDER — IOPAMIDOL (ISOVUE-370) INJECTION 76%
75.0000 mL | Freq: Once | INTRAVENOUS | Status: AC | PRN
  Administered 2017-04-08: 75 mL via INTRAVENOUS

## 2017-04-08 MED ORDER — KETOROLAC TROMETHAMINE 30 MG/ML IJ SOLN
INTRAMUSCULAR | Status: AC
  Administered 2017-04-08: 30 mg via INTRAVENOUS
  Filled 2017-04-08: qty 1

## 2017-04-08 MED ORDER — ALBUTEROL SULFATE HFA 108 (90 BASE) MCG/ACT IN AERS: 2.0000 | INHALATION_SPRAY | Freq: Four times a day (QID) | RESPIRATORY_TRACT | 0 refills | Status: DC | PRN

## 2017-04-08 MED ORDER — ALBUTEROL SULFATE (2.5 MG/3ML) 0.083% IN NEBU
2.5000 mg | INHALATION_SOLUTION | Freq: Once | RESPIRATORY_TRACT | Status: AC
Start: 2017-04-08 — End: 2017-04-08
  Administered 2017-04-08: 2.5 mg via RESPIRATORY_TRACT
  Filled 2017-04-08: qty 3

## 2017-04-08 NOTE — Telephone Encounter (Signed)
Called patient regarding pain in R posterior chest wall.  CXR reveealed no pneumonia and she has had no further fevers.  Patient did have R breast cancer s/p hormonal treatment.  Given pain and shortness of breath without evidence of infection on CXR, concern for possible PE.  Advised patient to go to ED to be evaluated as she will likely need CTA that is dificult to obtain in a timely fashion as outpatient. Patient agreeable to plan.  Virginia Crews, MD, MPH [facility] 04/08/2017 10:34 AM

## 2017-04-08 NOTE — ED Provider Notes (Signed)
[facility] Emergency Department Provider Note  ____________________________________________   First MD Initiated Contact with Patient 04/08/17 1157     (approximate)  I have reviewed the triage vital signs and the nursing notes.   HISTORY  Chief Complaint Cough; Shoulder Pain; Back Pain; and Shortness of Breath   HPI Catherine Arnold is a 66 y.o. female history of breast cancer in remission for 8 years who is presenting to the emergency department today for cough and chest pain over the past 2-3 weeks.  Says that her cough started 3 weeks ago and is currently productive of yellow sputum.  She has been on doxycycline for 2-1/2 days at this time.  Denies any fever or body aches.  Was sent in by her primary care doctor for concern for pulmonary embolus, versus recurrent disease with metastases to the lungs.  Patient says that her pain is on the right side of her chest and is sharp and especially worsened with deep breathing.  She says that the pain radiates up to her right, thoracic back.   Past Medical History:  Diagnosis Date  . Cancer [facility])     Patient Active Problem List   Diagnosis Date Noted  . CAP (community acquired pneumonia) 04/06/2017  . Allergic rhinitis 02/01/2015  . H/O malignant neoplasm of female breast 02/01/2015  . 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 H/O alcohol abuse 02/01/2015  . Osteopenia 02/01/2015  . 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 Avitaminosis D 02/01/2015  . Anxiety 10/22/2014    Past Surgical History:  Procedure Laterality Date  . APPENDECTOMY    . BUNIONECTOMY  2006  . ELBOW SURGERY Bilateral   . KNEE SURGERY Bilateral 2014   meniscus tear  . MASTECTOMY Bilateral 10/19/2008  . TONSILLECTOMY      Prior to Admission medications   Medication Sig Start Date End Date Taking? Authorizing Provider  acyclovir (ZOVIRAX) 400 MG tablet Take 1 tablet by mouth 3  times a day for 5 days as  needed 06/20/15  Yes Margarita Rana, MD    citalopram (CELEXA) 20 MG tablet TAKE 1 AND 1/2 TABLETS BY  MOUTH DAILY 06/29/16  Yes Fenton Malling M, PA-C  clonazePAM (KLONOPIN) 1 MG tablet Take 1 tablet (1 mg total) by mouth at bedtime. Patient taking differently: Take 0.5 mg at bedtime by mouth.  10/17/16  Yes Mar Daring, PA-C  doxycycline (VIBRA-TABS) 100 MG tablet Take 1 tablet (100 mg total) 2 (two) times daily by mouth. 04/06/17  Yes Bacigalupo, Dionne Bucy, MD  fluticasone [facility]) 50 MCG/ACT nasal spray USE 2 SPRAYS IN EACH  NOSTRIL DAILY 06/29/16  Yes Mar Daring, PA-C    Allergies Bee venom; Paclitaxel; Prochlorperazine maleate; Oxycodone-acetaminophen; and Sulfa antibiotics  Family History  Problem Relation Age of Onset  . Dementia Mother   . Seizures Sister     Social History Social History   Tobacco Use  . Smoking status: Former Smoker    Last attempt to quit: 01/20/1992    Years since quitting: 25.2  . Smokeless tobacco: Never Used  Substance Use Topics  . Alcohol use: Yes    Alcohol/week: 4.2 oz    Types: 7 Cans of beer per week  . Drug use: No    Review of Systems  Constitutional: No fever/chills Eyes: No visual changes. ENT: No sore throat. Cardiovascular: Denies chest pain. Respiratory: Denies shortness of breath.  Gastrointestinal: No abdominal pain.  No nausea, no vomiting.  No diarrhea.  No constipation.  Genitourinary: Negative for dysuria. Musculoskeletal: Negative for back pain. Skin: Negative for rash. Neurological: Negative for headaches, focal weakness or numbness.   ____________________________________________   PHYSICAL EXAM:  VITAL SIGNS: ED Triage Vitals  Enc Vitals Group     BP 04/08/17 1140 (!) 158/87     Pulse Rate 04/08/17 1140 84     Resp 04/08/17 1140 18     Temp 04/08/17 1140 98.4 F (36.9 C)     Temp Source 04/08/17 1140 Oral     SpO2 04/08/17 1140 98 %     Weight 04/08/17 1140 115 lb (52.2 kg)     Height 04/08/17 1140 5\' 6"  (1.676 m)     Head  Circumference --      Peak Flow --      Pain Score 04/08/17 1139 6     Pain Loc --      Pain Edu? --      Excl. in [place]? --     Constitutional: Alert and oriented. Well appearing and in no acute distress. Eyes: Conjunctivae are normal.  Head: Atraumatic. Nose: No congestion/rhinnorhea. Mouth/Throat: Mucous membranes are moist.  Neck: No stridor.   Cardiovascular: Normal rate, regular rhythm. Grossly normal heart sounds.  Good peripheral circulation. Respiratory: Normal respiratory effort.  No retractions. Lungs CTAB.  Patient is coughing intermittently in the room but able to speak in full sentences. Gastrointestinal: Soft and nontender. No distention.  Musculoskeletal: No lower extremity tenderness nor edema.  No joint effusions. Neurologic:  Normal speech and language. No gross focal neurologic deficits are appreciated. Skin:  Skin is warm, dry and intact. No rash noted. Psychiatric: Mood and affect are normal. Speech and behavior are normal.  ____________________________________________   LABS (all labs ordered are listed, but only abnormal results are displayed)  Labs Reviewed  BASIC METABOLIC PANEL - Abnormal; Notable for the following components:      Result Value   Sodium 133 (*)    Chloride 98 (*)    Glucose, Bld 100 (*)    All other components within normal limits  CBC  TROPONIN I   ____________________________________________  EKG  ED ECG REPORT I, Doran Stabler, the attending physician, personally viewed and interpreted this ECG.   Date: 04/08/2017  EKG Time: 1147  Rate: 78  Rhythm: normal sinus rhythm  Axis: Normal  Intervals:none  ST&T Change: No ST segment elevation or depression.  No abnormal T wave inversion.  ____________________________________________  RADIOLOGY  No acute disease.  No pulmonary embolus.  Areas of slight scarring or atelectasis.  Inhomogenous thyroid, likely goiter.  Atherosclerosis.  Subcentimeter probable left adrenal  adenoma. ____________________________________________   PROCEDURES  Procedure(s) performed:   Procedures  Critical Care performed:   ____________________________________________   INITIAL IMPRESSION / ASSESSMENT AND PLAN / ED COURSE  Pertinent labs & imaging results that were available during my care of the patient were reviewed by me and considered in my medical decision making (see chart for details).  Differential includes, but is not limited to, viral syndrome, bronchitis including COPD exacerbation, pneumonia, reactive airway disease including asthma, CHF including exacerbation with or without pulmonary/interstitial edema, pneumothorax, ACS, thoracic trauma, and pulmonary embolism. Differential diagnosis includes, but is not limited to, ACS, aortic dissection, pulmonary embolism, cardiac tamponade, pneumothorax, pneumonia, pericarditis, myocarditis, GI-related causes including esophagitis/gastritis, and musculoskeletal chest wall pain.    As part of my medical decision making, I reviewed the following data within the [place] chart reviewed      -----------------------------------------  2:06 PM on 04/08/2017 -----------------------------------------  Patient feeling improved with Toradol as well as albuterol treatment.  Reassuring CAT scan.  We discussed the multiple findings including the inhomogenous thyroid as well as the left adenoma on the adrenal gland.  The patient was following up with her primary care doctor.  She will be discharged home with an albuterol inhaler.  She will use NSAIDs as well as icy hot for the pain which is likely pleurisy and chest wall pain.  She will continue her doxycycline.  She is understanding of the plan willing to comply.  ____________________________________________   FINAL CLINICAL IMPRESSION(S) / ED DIAGNOSES  Cough.  Pleurisy.  Chest wall pain.    NEW MEDICATIONS STARTED DURING THIS VISIT:  This SmartLink  is deprecated. Use AVSMEDLIST instead to display the medication list for a patient.   Note:  This document was prepared using Dragon voice recognition software and may include unintentional dictation errors.     Orbie Pyo, MD 04/08/17 [phone]

## 2017-04-08 NOTE — ED Triage Notes (Signed)
Pt here with c/o right shoulder and back pain worsening over the past few days, states she feels very shob with "stabbing" pain in her right upper back with inhalation. Pt states Xray on Sat did not show any pneumonia, states her Dr is concerned for blood clot in right lung. Pt appears in no distress at this time.

## 2017-04-08 NOTE — ED Notes (Signed)
Pt c/o RT side chest and back pain with inhalationx few days, productive cough x1 wk. PCP told pt possible blood clot to RT lung

## 2017-04-08 NOTE — ED Notes (Signed)
Pt taken to Xray, per Dr Quentin Cornwall, pt need to be roomed.

## 2017-04-08 NOTE — ED Notes (Signed)
Report given to Jordan,RN

## 2017-04-09 ENCOUNTER — Other Ambulatory Visit: Payer: Self-pay

## 2017-04-12 ENCOUNTER — Ambulatory Visit: Payer: 59 | Admitting: Family Medicine

## 2017-04-16 ENCOUNTER — Encounter: Payer: Self-pay | Admitting: Family Medicine

## 2017-04-17 ENCOUNTER — Ambulatory Visit: Payer: 59 | Admitting: Family Medicine

## 2017-04-17 VITALS — BP 110/68 | HR 82 | Temp 97.7°F | Resp 16 | Wt 112.0 lb

## 2017-04-17 DIAGNOSIS — R059 Cough, unspecified: Secondary | ICD-10-CM

## 2017-04-17 DIAGNOSIS — R0789 Other chest pain: Secondary | ICD-10-CM | POA: Diagnosis not present

## 2017-04-17 DIAGNOSIS — R05 Cough: Secondary | ICD-10-CM

## 2017-04-17 NOTE — Progress Notes (Signed)
Patient: Catherine Arnold Female    DOB: 1950-09-19   66 y.o.   MRN: [id number] Visit Date: 04/17/2017  Today's Provider: Lavon Paganini, MD   Chief Complaint  Patient presents with  . ER Follow Up   Subjective:    HPI     Follow up ER visit  Patient was seen in ER for cough, shoulder pain, back pain, and SOB on 04/08/2017. She was treated for cough, pleurisy, chest wall pain. Treatment for this included prescribing albuterol inhaler. Toradol was given in the ER, and imaging was done, and was negative for PE. She would like to discuss these results.  She reports good compliance with treatment.  She reports this condition is Improved. She states she is 70% improved, but she is still experiencing fatigue, dull ache in her upper mid back, productive cough with yellow sputum. She states she is using her inhaler several times per day, without much relief. She is also taking ibuprofen, without relief.  ------------------------------------------------------------------------------------    Allergies  Allergen Reactions  . Bee Venom Anaphylaxis  . Paclitaxel Shortness Of Breath and Anaphylaxis    Other reaction(s): ANAPHYLAXIS Other reaction(s): SHORTNESS OF BREATH  . Prochlorperazine Maleate Other (See Comments)    Other reaction(s): Unknown reaction  . Oxycodone-Acetaminophen     Other reaction(s): NAUSEA  . Sulfa Antibiotics      Current Outpatient Medications:  .  albuterol (PROVENTIL HFA;VENTOLIN HFA) 108 (90 Base) MCG/ACT inhaler, Inhale 2 puffs every 6 (six) hours as needed into the lungs., Disp: 1 Inhaler, Rfl: 0 .  citalopram (CELEXA) 20 MG tablet, TAKE 1 AND 1/2 TABLETS BY  MOUTH DAILY, Disp: 135 tablet, Rfl: 3 .  clonazePAM (KLONOPIN) 1 MG tablet, Take 1 tablet (1 mg total) by mouth at bedtime. (Patient taking differently: Take 0.5 mg at bedtime by mouth. ), Disp: 90 tablet, Rfl: 1 .  fluticasone (FLONASE) 50 MCG/ACT nasal spray, USE 2 SPRAYS IN EACH   NOSTRIL DAILY, Disp: 48 g, Rfl: 3 .  acyclovir (ZOVIRAX) 400 MG tablet, Take 1 tablet by mouth 3  times a day for 5 days as  needed (Patient not taking: Reported on 04/17/2017), Disp: 90 tablet, Rfl: 1  Review of Systems  Constitutional: Positive for activity change, appetite change and fatigue. Negative for chills, diaphoresis and fever.  Respiratory: Positive for cough and chest tightness. Negative for shortness of breath and wheezing.   Cardiovascular: Negative for chest pain.  Musculoskeletal: Positive for back pain.    Social History   Tobacco Use  . Smoking status: Former Smoker    Last attempt to quit: 01/20/1992    Years since quitting: 25.2  . Smokeless tobacco: Never Used  Substance Use Topics  . Alcohol use: Yes    Alcohol/week: 4.2 oz    Types: 7 Cans of beer per week   Objective:   BP 110/68 (BP Location: Left Arm, Patient Position: Sitting, Cuff Size: Normal)   Pulse 82   Temp 97.7 F (36.5 C) (Oral)   Resp 16   Wt 112 lb (50.8 kg)   SpO2 98%   BMI 18.08 kg/m  Vitals:   04/17/17 0831  BP: 110/68  Pulse: 82  Resp: 16  Temp: 97.7 F (36.5 C)  TempSrc: Oral  SpO2: 98%  Weight: 112 lb (50.8 kg)     Physical Exam  Constitutional: She is oriented to person, place, and time. She appears well-developed and well-nourished. No distress.  HENT:  Head: Normocephalic  and atraumatic.  Right Ear: External ear normal.  Left Ear: External ear normal.  Nose: Nose normal.  Mouth/Throat: Oropharynx is clear and moist. No oropharyngeal exudate.  Eyes: Conjunctivae are normal. Pupils are equal, round, and reactive to light. No scleral icterus.  Neck: Neck supple. No thyromegaly present.  Cardiovascular: Normal rate, regular rhythm, normal heart sounds and intact distal pulses.  No murmur heard. Pulmonary/Chest: Effort normal and breath sounds normal. No respiratory distress. She has no wheezes. She has no rales. She exhibits tenderness.  Musculoskeletal: She exhibits  no edema or deformity.  Lymphadenopathy:    She has no cervical adenopathy.  Neurological: She is alert and oriented to person, place, and time.  Skin: Skin is warm and dry. No rash noted.  Psychiatric: She has a normal mood and affect. Her behavior is normal.  Vitals reviewed.       Assessment & Plan:     1. Cough - seems to have post-viral cough as other symptoms have resolved - discussed that this and fatigue can linger for several weeks - recent CXR and CTA Chest without pneumonia and patient finished abx course - lungs clear today, so no indication for repeat imaging  2. Chest wall pain - suspect intercostal muscle strain - recent CTA negative for PE - discussed rest, ice/heat, NSAIDs - return precautions discussed      The entirety of the information documented in the History of Present Illness, Review of Systems and Physical Exam were personally obtained by me. Portions of this information were initially documented by Raquel Sarna Ratchford, CMA and reviewed by me for thoroughness and accuracy.     Lavon Paganini, MD  [facility]

## 2017-04-30 ENCOUNTER — Encounter: Payer: Self-pay | Admitting: Family Medicine

## 2017-05-01 ENCOUNTER — Encounter: Payer: Self-pay | Admitting: Family Medicine

## 2017-05-01 DIAGNOSIS — B001 Herpesviral vesicular dermatitis: Secondary | ICD-10-CM

## 2017-05-01 MED ORDER — ACYCLOVIR 400 MG PO TABS: ORAL_TABLET | ORAL | 3 refills | Status: DC

## 2017-05-01 MED ORDER — ACYCLOVIR 5 % EX OINT: 1.0000 "application " | TOPICAL_OINTMENT | CUTANEOUS | 1 refills | Status: DC

## 2017-05-14 ENCOUNTER — Other Ambulatory Visit: Payer: Self-pay | Admitting: Physician Assistant

## 2017-05-14 DIAGNOSIS — F339 Major depressive disorder, recurrent, unspecified: Secondary | ICD-10-CM

## 2017-05-27 ENCOUNTER — Other Ambulatory Visit: Payer: Self-pay | Admitting: Family Medicine

## 2017-05-27 DIAGNOSIS — B001 Herpesviral vesicular dermatitis: Secondary | ICD-10-CM

## 2017-05-27 NOTE — Telephone Encounter (Signed)
[facility] faxed a refill request for the following medication. Qty: 90   acyclovir (ZOVIRAX) 400 MG tablet

## 2017-05-28 MED ORDER — ACYCLOVIR 400 MG PO TABS: ORAL_TABLET | ORAL | 3 refills | Status: DC

## 2017-06-14 ENCOUNTER — Ambulatory Visit (INDEPENDENT_AMBULATORY_CARE_PROVIDER_SITE_OTHER): Payer: Managed Care, Other (non HMO) | Admitting: Family Medicine

## 2017-06-14 DIAGNOSIS — Z23 Encounter for immunization: Secondary | ICD-10-CM | POA: Diagnosis not present

## 2017-06-14 NOTE — Progress Notes (Signed)
Pt presents for initial Shingrix vaccine.

## 2017-07-10 ENCOUNTER — Other Ambulatory Visit: Payer: Self-pay | Admitting: Physician Assistant

## 2017-07-10 DIAGNOSIS — J302 Other seasonal allergic rhinitis: Secondary | ICD-10-CM

## 2017-08-20 ENCOUNTER — Telehealth: Payer: Self-pay

## 2017-08-20 NOTE — Telephone Encounter (Signed)
LMTCB  Patient needs to make appointment to have her 2nd shingles vaccine

## 2017-08-20 NOTE — Telephone Encounter (Signed)
Pt called back to schedule appointment for tomorrow.

## 2017-08-21 ENCOUNTER — Ambulatory Visit (INDEPENDENT_AMBULATORY_CARE_PROVIDER_SITE_OTHER): Payer: Managed Care, Other (non HMO) | Admitting: Family Medicine

## 2017-08-21 DIAGNOSIS — Z23 Encounter for immunization: Secondary | ICD-10-CM | POA: Diagnosis not present

## 2017-08-21 NOTE — Progress Notes (Signed)
Pt presents for 2nd Shingrix only. 

## 2017-09-20 ENCOUNTER — Other Ambulatory Visit: Payer: Self-pay | Admitting: Family Medicine

## 2017-09-20 DIAGNOSIS — F419 Anxiety disorder, unspecified: Secondary | ICD-10-CM

## 2017-09-20 MED ORDER — CLONAZEPAM 1 MG PO TABS: 1.0000 mg | ORAL_TABLET | Freq: Every day | ORAL | 1 refills | Status: DC

## 2017-09-20 NOTE — Telephone Encounter (Signed)
[facility] faxed a refill request for the following medication. Thanks CC  clonazePAM (KLONOPIN) 1 MG tablet

## 2017-09-30 ENCOUNTER — Encounter: Payer: Self-pay | Admitting: Family Medicine

## 2017-09-30 DIAGNOSIS — B001 Herpesviral vesicular dermatitis: Secondary | ICD-10-CM

## 2017-10-01 MED ORDER — ACYCLOVIR 400 MG PO TABS
ORAL_TABLET | ORAL | 3 refills | Status: DC
Start: 2017-10-01 — End: 2018-10-23

## 2017-10-01 MED ORDER — LIDOCAINE VISCOUS HCL 2 % MT SOLN: 15.0000 mL | OROMUCOSAL | 1 refills | Status: DC | PRN

## 2017-11-01 LAB — LIPID PANEL
CHOLESTEROL: 196 (ref 0–200)
HDL: 89 — AB (ref 35–70)
LDL Cholesterol: 86
TRIGLYCERIDES: 104 (ref 40–160)

## 2017-11-01 LAB — HEMOGLOBIN A1C: HEMOGLOBIN A1C: 5.4

## 2017-11-01 LAB — BASIC METABOLIC PANEL: Glucose: 84

## 2018-02-22 ENCOUNTER — Ambulatory Visit (INDEPENDENT_AMBULATORY_CARE_PROVIDER_SITE_OTHER): Payer: Managed Care, Other (non HMO)

## 2018-02-22 DIAGNOSIS — Z23 Encounter for immunization: Secondary | ICD-10-CM

## 2018-03-10 ENCOUNTER — Encounter: Payer: Self-pay | Admitting: Family Medicine

## 2018-03-16 DIAGNOSIS — T7840XA Allergy, unspecified, initial encounter: Secondary | ICD-10-CM | POA: Diagnosis not present

## 2018-03-27 ENCOUNTER — Other Ambulatory Visit: Payer: Self-pay | Admitting: Family Medicine

## 2018-03-27 DIAGNOSIS — F419 Anxiety disorder, unspecified: Secondary | ICD-10-CM

## 2018-04-28 ENCOUNTER — Other Ambulatory Visit: Payer: Self-pay | Admitting: Family Medicine

## 2018-04-28 DIAGNOSIS — J302 Other seasonal allergic rhinitis: Secondary | ICD-10-CM

## 2018-06-02 ENCOUNTER — Other Ambulatory Visit: Payer: Self-pay | Admitting: Family Medicine

## 2018-06-02 DIAGNOSIS — F339 Major depressive disorder, recurrent, unspecified: Secondary | ICD-10-CM

## 2018-06-02 MED ORDER — CITALOPRAM HYDROBROMIDE 20 MG PO TABS: 30.0000 mg | ORAL_TABLET | Freq: Every day | ORAL | 3 refills | Status: DC

## 2018-06-02 NOTE — Telephone Encounter (Signed)
[facility] faxed refill request for the following medications:  citalopram (CELEXA) 20 MG tablet  Please advise.

## 2018-06-11 ENCOUNTER — Ambulatory Visit (INDEPENDENT_AMBULATORY_CARE_PROVIDER_SITE_OTHER): Payer: Medicare Other | Admitting: Family Medicine

## 2018-06-11 ENCOUNTER — Encounter: Payer: Self-pay | Admitting: Family Medicine

## 2018-06-11 VITALS — BP 112/69 | HR 77 | Temp 98.1°F | Wt 112.2 lb

## 2018-06-11 DIAGNOSIS — Z0000 Encounter for general adult medical examination without abnormal findings: Secondary | ICD-10-CM

## 2018-06-11 DIAGNOSIS — Z853 Personal history of malignant neoplasm of breast: Secondary | ICD-10-CM | POA: Diagnosis not present

## 2018-06-11 DIAGNOSIS — M8580 Other specified disorders of bone density and structure, unspecified site: Secondary | ICD-10-CM

## 2018-06-11 DIAGNOSIS — Z780 Asymptomatic menopausal state: Secondary | ICD-10-CM | POA: Diagnosis not present

## 2018-06-11 DIAGNOSIS — Z23 Encounter for immunization: Secondary | ICD-10-CM | POA: Diagnosis not present

## 2018-06-11 DIAGNOSIS — S81812A Laceration without foreign body, left lower leg, initial encounter: Secondary | ICD-10-CM | POA: Diagnosis not present

## 2018-06-11 NOTE — Progress Notes (Signed)
Patient: Catherine Arnold, Female    DOB: 1950-10-01, 68 y.o.   MRN: [id number] Visit Date: 06/11/2018  Today's Provider: Lavon Paganini, MD   Chief Complaint  Patient presents with  . Annual Exam   Subjective:  I, [name], CMA, am acting as a scribe for Lavon Paganini, MD.    First annual wellness visit - Welcome to Medicare Catherine Arnold is a 68 y.o. female who presents today for her First Annual Wellness Visit. She feels  well. She reports exercising includes walking and golfing. She reports she is sleeping well.  Her wife left her in 10/2017, but she is struggling with this because she is in limbo.  They are not working toward divorce and not legally separated but they are living separate lives.  S/p bilateral mastectomy for breast cancer. Needs repeat BMD   Review of Systems  Constitutional: Negative.   HENT: Positive for rhinorrhea.   Eyes: Negative.   Respiratory: Negative.   Cardiovascular: Negative.   Gastrointestinal: Negative.   Endocrine: Negative.   Genitourinary: Negative.   Musculoskeletal: Positive for arthralgias, neck pain and neck stiffness.  Skin: Negative.   Allergic/Immunologic: Positive for environmental allergies.  Neurological: Negative.   Hematological: Negative.   Psychiatric/Behavioral: Negative.     Social History   Socioeconomic History  . Marital status: Married    Spouse name: Not on file  . Number of children: Not on file  . Years of education: Not on file  . Highest education level: Not on file  Occupational History  . Not on file  Social Needs  . Financial resource strain: Not on file  . Food insecurity:    Worry: Not on file    Inability: Not on file  . Transportation needs:    Medical: Not on file    Non-medical: Not on file  Tobacco Use  . Smoking status: Former Smoker    Last attempt to quit: 01/20/1992    Years since quitting: 26.4  . Smokeless tobacco: Never Used  Substance and Sexual Activity  .  Alcohol use: Yes    Alcohol/week: 7.0 standard drinks    Types: 7 Cans of beer per week  . Drug use: No  . Sexual activity: Not on file  Lifestyle  . Physical activity:    Days per week: Not on file    Minutes per session: Not on file  . Stress: Not on file  Relationships  . Social connections:    Talks on phone: Not on file    Gets together: Not on file    Attends religious service: Not on file    Active member of club or organization: Not on file    Attends meetings of clubs or organizations: Not on file    Relationship status: Not on file  . Intimate partner violence:    Fear of current or ex partner: Not on file    Emotionally abused: Not on file    Physically abused: Not on file    Forced sexual activity: Not on file  Other Topics Concern  . Not on file  Social History Narrative  . Not on file    Past Medical History:  Diagnosis Date  . Cancer [facility])     Patient Active Problem List   Diagnosis Date Noted  . Allergic rhinitis 02/01/2015  . H/O malignant neoplasm of female breast 02/01/2015  . 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 H/O alcohol abuse 02/01/2015  . Osteopenia 02/01/2015  .  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 Avitaminosis D 02/01/2015  . Anxiety 10/22/2014    Past Surgical History:  Procedure Laterality Date  . APPENDECTOMY    . BUNIONECTOMY  2006  . ELBOW SURGERY Bilateral   . KNEE SURGERY Bilateral 2014   meniscus tear  . MASTECTOMY Bilateral 10/19/2008  . TONSILLECTOMY      Her family history includes Dementia in her mother; Seizures in her sister.      Current Outpatient Medications:  .  acyclovir (ZOVIRAX) 400 MG tablet, Take 1 tablet by mouth 3  times a day for 5 days as  needed, Disp: 15 tablet, Rfl: 3 .  acyclovir ointment (ZOVIRAX) 5 %, Apply 1 application topically every 3 (three) hours., Disp: 30 g, Rfl: 1 .  albuterol (PROVENTIL HFA;VENTOLIN HFA) 108 (90 Base) MCG/ACT inhaler, Inhale 2 puffs every 6 (six) hours as needed into the lungs.,  Disp: 1 Inhaler, Rfl: 0 .  citalopram (CELEXA) 20 MG tablet, Take 1.5 tablets (30 mg total) by mouth daily., Disp: 135 tablet, Rfl: 3 .  clonazePAM (KLONOPIN) 1 MG tablet, TAKE 1 TABLET BY MOUTH AT  BEDTIME, Disp: 90 tablet, Rfl: 0 .  fluticasone (FLONASE) 50 MCG/ACT nasal spray, USE 2 SPRAYS IN EACH  NOSTRIL DAILY, Disp: 48 g, Rfl: 3 .  lidocaine (XYLOCAINE) 2 % solution, Use as directed 15 mLs in the mouth or throat every 4 (four) hours as needed for mouth pain. (Patient not taking: Reported on 06/11/2018), Disp: 100 mL, Rfl: 1   Patient Care Team: Virginia Crews, MD as PCP - General (Family Medicine)      Objective:   Vitals: BP 112/69 (BP Location: Left Arm, Patient Position: Sitting, Cuff Size: Normal)   Pulse 77   Temp 98.1 F (36.7 C) (Oral)   Wt 112 lb 3.2 oz (50.9 kg)   SpO2 98%   BMI 18.11 kg/m   Physical Exam Vitals signs reviewed.  Constitutional:      General: She is not in acute distress.    Appearance: Normal appearance. She is well-developed. She is not diaphoretic.  HENT:     Head: Normocephalic and atraumatic.     Right Ear: Tympanic membrane, ear canal and external ear normal.     Left Ear: Tympanic membrane, ear canal and external ear normal.     Nose: Nose normal.     Mouth/Throat:     Mouth: Mucous membranes are moist.     Pharynx: Oropharynx is clear. No oropharyngeal exudate.  Eyes:     General: No scleral icterus.    Conjunctiva/sclera: Conjunctivae normal.     Pupils: Pupils are equal, round, and reactive to light.  Neck:     Musculoskeletal: Neck supple.     Thyroid: No thyromegaly.  Cardiovascular:     Rate and Rhythm: Normal rate and regular rhythm.     Pulses: Normal pulses.     Heart sounds: Normal heart sounds. No murmur.  Pulmonary:     Effort: Pulmonary effort is normal. No respiratory distress.     Breath sounds: Normal breath sounds. No wheezing or rales.  Abdominal:     General: Bowel sounds are normal. There is no distension.      Palpations: Abdomen is soft.     Tenderness: There is no abdominal tenderness. There is no guarding or rebound.  Musculoskeletal:        General: No deformity.     Right lower leg: No edema.     Left lower  leg: No edema.  Lymphadenopathy:     Cervical: No cervical adenopathy.  Skin:    General: Skin is warm and dry.     Capillary Refill: Capillary refill takes less than 2 seconds.     Findings: No rash.     Comments: Small laceration on anterior L shin  Neurological:     General: No focal deficit present.     Mental Status: She is alert and oriented to person, place, and time.     Cranial Nerves: No cranial nerve deficit.     Sensory: No sensory deficit.     Gait: Gait normal.  Psychiatric:        Mood and Affect: Mood normal.        Behavior: Behavior normal.        Thought Content: Thought content normal.     Activities of Daily Living In your present state of health, do you have any difficulty performing the following activities: 06/11/2018  Hearing? N  Vision? Y  Difficulty concentrating or making decisions? N  Walking or climbing stairs? N  Dressing or bathing? N  Doing errands, shopping? N  Some recent data might be hidden    Fall Risk Assessment Fall Risk  06/11/2018 03/07/2017  Falls in the past year? 1 No  Number falls in past yr: 0 -  Injury with Fall? 1 -     Depression Screen PHQ 2/9 Scores 06/11/2018 03/07/2017  PHQ - 2 Score 1 0  PHQ- 9 Score 3 -    No flowsheet data found.    Assessment & Plan:     Annual Wellness Visit  Reviewed patient's Family Medical History Reviewed and updated list of patient's medical providers Assessment of cognitive impairment was done Assessed patient's functional ability Established a written schedule for health screening [place] Completed and Reviewed  Exercise Activities and Dietary recommendations Goals   None     Immunization History  Administered Date(s) Administered  .  Influenza, High Dose Seasonal PF 03/07/2017, 02/22/2018  . Influenza,inj,Quad PF,6+ Mos 03/26/2015  . Influenza-Unspecified 02/09/2016  . Pneumococcal Conjugate-13 03/07/2017  . Pneumococcal Polysaccharide-23 06/11/2018  . Td 06/11/2018  . Zoster 02/27/2012  . Zoster Recombinat (Shingrix) 06/14/2017, 08/21/2017    Health Maintenance  Topic Date Due  . Samul Dada  09/10/1970  . PNA vac Low Risk Adult (2 of 2 - PPSV23) 03/07/2018  . Fecal DNA (Cologuard)  04/05/2020  . INFLUENZA VACCINE  Completed  . DEXA SCAN  Completed  . Hepatitis C Screening  Completed     Discussed health benefits of physical activity, and encouraged her to engage in regular exercise appropriate for her age and condition.    ------------------------------------------------------------------------------------------------------------  Problem List Items Addressed This Visit      Musculoskeletal and Integument   Osteopenia   Relevant Orders   DG Bone Density     Other   H/O malignant neoplasm of female breast    Other Visit Diagnoses    Medicare annual wellness visit, initial    -  Primary   Relevant Orders   EKG 12-Lead (Completed)   Postmenopausal       Relevant Orders   DG Bone Density   Laceration of left lower extremity, initial encounter       Relevant Orders   Td vaccine greater than or equal to 7yo preservative free IM (Completed)   Need for 23-polyvalent pneumococcal polysaccharide vaccine       Relevant Orders   Pneumococcal polysaccharide  vaccine 23-valent greater than or equal to 2yo subcutaneous/IM (Completed)       Return in about 1 year (around 06/12/2019) for AWV/CPE.   The entirety of the information documented in the History of Present Illness, Review of Systems and Physical Exam were personally obtained by me. Portions of this information were initially documented by [facility], CMA and reviewed by me for thoroughness and accuracy.    Virginia Crews, MD,  [facility] 06/11/2018 11:27 AM

## 2018-06-11 NOTE — Patient Instructions (Signed)
Preventive Care 67 Years and Older, Female Preventive care refers to lifestyle choices and visits with your health care provider that can promote health and wellness. What does preventive care include?  A yearly physical exam. This is also called an annual well check.  Dental exams once or twice a year.  Routine eye exams. Ask your health care provider how often you should have your eyes checked.  Personal lifestyle choices, including: ? Daily care of your teeth and gums. ? Regular physical activity. ? Eating a healthy diet. ? Avoiding tobacco and drug use. ? Limiting alcohol use. ? Practicing safe sex. ? Taking low-dose aspirin every day. ? Taking vitamin and mineral supplements as recommended by your health care provider. What happens during an annual well check? The services and screenings done by your health care provider during your annual well check will depend on your age, overall health, lifestyle risk factors, and family history of disease. Counseling Your health care provider may ask you questions about your:  Alcohol use.  Tobacco use.  Drug use.  Emotional well-being.  Home and relationship well-being.  Sexual activity.  Eating habits.  History of falls.  Memory and ability to understand (cognition).  Work and work Statistician.  Reproductive health.  Screening You may have the following tests or measurements:  Height, weight, and BMI.  Blood pressure.  Lipid and cholesterol levels. These may be checked every 5 years, or more frequently if you are over 30 years old.  Skin check.  Lung cancer screening. You may have this screening every year starting at age 27 if you have a 30-pack-year history of smoking and currently smoke or have quit within the past 15 years.  Colorectal cancer screening. All adults should have this screening starting at age 33 and continuing until age 46. You will have tests every 1-10 years, depending on your results and the  type of screening test. People at increased risk should start screening at an earlier age. Screening tests may include: ? Guaiac-based fecal occult blood testing. ? Fecal immunochemical test (FIT). ? Stool DNA test. ? Virtual colonoscopy. ? Sigmoidoscopy. During this test, a flexible tube with a tiny camera (sigmoidoscope) is used to examine your rectum and lower colon. The sigmoidoscope is inserted through your anus into your rectum and lower colon. ? Colonoscopy. During this test, a long, thin, flexible tube with a tiny camera (colonoscope) is used to examine your entire colon and rectum.  Hepatitis C blood test.  Hepatitis B blood test.  Sexually transmitted disease (STD) testing.  Diabetes screening. This is done by checking your blood sugar (glucose) after you have not eaten for a while (fasting). You may have this done every 1-3 years.  Bone density scan. This is done to screen for osteoporosis. You may have this done starting at age 37.  Mammogram. This may be done every 1-2 years. Talk to your health care provider about how often you should have regular mammograms. Talk with your health care provider about your test results, treatment options, and if necessary, the need for more tests. Vaccines Your health care provider may recommend certain vaccines, such as:  Influenza vaccine. This is recommended every year.  Tetanus, diphtheria, and acellular pertussis (Tdap, Td) vaccine. You may need a Td booster every 10 years.  Varicella vaccine. You may need this if you have not been vaccinated.  Zoster vaccine. You may need this after age 38.  Measles, mumps, and rubella (MMR) vaccine. You may need at least  one dose of MMR if you were born in 1957 or later. You may also need a second dose.  Pneumococcal 13-valent conjugate (PCV13) vaccine. One dose is recommended after age 24.  Pneumococcal polysaccharide (PPSV23) vaccine. One dose is recommended after age 24.  Meningococcal  vaccine. You may need this if you have certain conditions.  Hepatitis A vaccine. You may need this if you have certain conditions or if you travel or work in places where you may be exposed to hepatitis A.  Hepatitis B vaccine. You may need this if you have certain conditions or if you travel or work in places where you may be exposed to hepatitis B.  Haemophilus influenzae type b (Hib) vaccine. You may need this if you have certain conditions. Talk to your health care provider about which screenings and vaccines you need and how often you need them. This information is not intended to replace advice given to you by your health care provider. Make sure you discuss any questions you have with your health care provider. Document Released: 06/03/2015 Document Revised: 06/27/2017 Document Reviewed: 03/08/2015 Elsevier Interactive Patient Education  2019 [employer].

## 2018-06-16 DIAGNOSIS — H2513 Age-related nuclear cataract, bilateral: Secondary | ICD-10-CM | POA: Diagnosis not present

## 2018-06-16 DIAGNOSIS — H04123 Dry eye syndrome of bilateral lacrimal glands: Secondary | ICD-10-CM | POA: Diagnosis not present

## 2018-06-23 DIAGNOSIS — H04123 Dry eye syndrome of bilateral lacrimal glands: Secondary | ICD-10-CM | POA: Diagnosis not present

## 2018-07-26 ENCOUNTER — Other Ambulatory Visit: Payer: Self-pay | Admitting: Family Medicine

## 2018-07-26 DIAGNOSIS — F419 Anxiety disorder, unspecified: Secondary | ICD-10-CM

## 2018-08-19 ENCOUNTER — Encounter: Payer: Self-pay | Admitting: Family Medicine

## 2018-09-17 ENCOUNTER — Telehealth: Payer: 59 | Admitting: Physician Assistant

## 2018-09-17 DIAGNOSIS — S30861A Insect bite (nonvenomous) of abdominal wall, initial encounter: Secondary | ICD-10-CM | POA: Diagnosis not present

## 2018-09-17 DIAGNOSIS — W57XXXA Bitten or stung by nonvenomous insect and other nonvenomous arthropods, initial encounter: Secondary | ICD-10-CM

## 2018-09-17 MED ORDER — DOXYCYCLINE HYCLATE 100 MG PO TABS: 100.0000 mg | ORAL_TABLET | Freq: Two times a day (BID) | ORAL | 0 refills | Status: DC

## 2018-09-17 NOTE — Progress Notes (Signed)
Thank you for describing your tick bite, Here is how we plan to help! Based on the information that you shared with me it looks like you have A tick that bite that we will treat with a short course of doxycycline.    Approximately 5 minutes was spent documenting and reviewing patient's chart.    In most cases a tick bite is painless and does not itch.  Most tick bites in which the tick is quickly removed do not require prescriptions. Ticks can transmit several diseases if they are infected and remain attacked to your skin. Therefore the length that the tick was attached and any symptoms you have experienced after the bite are import to accurately develop your custom treatment plan. In most cases a single dose of doxycycline may prevent the development of a more serious condition.  Based on your information I have Provided a home care guide for tick bites  and  instructions on when to call for help. and Your symptoms indicate that you need a longer course of antibiotics and a follow up visit with a provider. I have sent doxycycline 100 mg twice a day for 21 days to the pharmacy that you selected. You will need to schedule a follow up visit with your provider. If you do not have a primary care provider you may use our telehealth physicians on the web at [place].  Which ticks  are associated with illness?  The Wood Tick (dog tick) is the size of a watermelon seed and can sometimes transmit [facility] spotted fever and [place] tick fever.   The Deer Tick (black-legged tick) is between the size of a poppy seed (pin head) and an apple seed, and can sometimes transmit Lyme disease.  A brown to black tick with a white splotch on its back is likely a female Amblyomma americanum ([place] tick). This tick has been associated with Southern Tick Associated illness ( STARI)  Lyme disease has become the most common tick-borne illness in the [place]. The risk of Lyme disease following a  recognized deer tick bite is estimated to be 1%.  The majority of cases of Lyme disease start with a bull's eye rash at the site of the tick bite. The rash can occur days to weeks (typically 7-10 days) after a tick bite. Treatment with antibiotics is indicated if this rash appears. Flu-like symptoms may accompany the rash, including: fever, chills, headaches, muscle aches, and fatigue. Removing ticks promptly may prevent tick borne disease.  What can be used to prevent Tick Bites?   Insect repellant with at leas 20% DEET.  Wearing long pants with sock and shoes.  Avoiding tall grass and heavily wooded areas.  Checking your skin after being outdoors.  Shower with a washcloth after outdoor exposures.  HOME CARE ADVICE FOR TICK BITE  1. Wood Tick Removal:  o Use a pair of tweezers and grasp the wood tick close to the skin (on its head). Pull the wood tick straight upward without twisting or crushing it. Maintain a steady pressure until it releases its grip.   o If tweezers aren't available, use fingers, a loop of thread around the jaws, or a needle between the jaws for traction.  o Note: covering the tick with petroleum jelly, nail polish or rubbing alcohol doesn't work. Neither does touching the tick with a hot or cold object. 2. Tiny Deer Tick Removal:   o Needs to be scraped off with a knife blade or credit card  edge. o Place tick in a sealed container (e.g. glass jar, zip lock plastic bag), in case your doctor wants to see it. 3. Tick's Head Removal:  o If the wood tick's head breaks off in the skin, it must be removed. Clean the skin. Then use a sterile needle to uncover the head and lift it out or scrape it off.  o If a very small piece of the head remains, the skin will eventually slough it off. 4. Antibiotic Ointment:  o Wash the wound and your hands with soap and water after removal to prevent catching any tick disease.  Apply an over the counter antibiotic ointment (e.g.  bacitracin) to the bite once. 5. Expected Course: Tick bites normally don't itch or hurt. That's why they often go unnoticed. 6. Call Your Doctor If:  o You can't remove the tick or the tick's head o Fever, a severe head ache, or rash occur in the next 2 weeks o Bite begins to look infected o Lyme's disease is common in your area o You have not had a tetanus in the last 10 years o Your current symptoms become worse    MAKE SURE YOU   Understand these instructions.  Will watch your condition.  Will get help right away if you are not doing well or get worse.   Thank you for choosing an e-visit.  Your e-visit answers were reviewed by a board certified advanced clinical practitioner to complete your personal care plan. Depending upon the condition, your plan could have included both over the counter or prescription medications. Please review your pharmacy choice. If there is a problem you may use MyChart messaging to have the prescription routed to another pharmacy. Your safety is important to [place]. If you have drug allergies check your prescription carefully.   You can use MyChart to ask questions about today's visit, request a non-urgent call back, or ask for a work or school excuse for 24 hours related to this e-Visit. If it has been greater than 24 hours you will need to follow up with your provider, or enter a new e-Visit to address those concerns.  You will get an email in the next two days asking about your experience. I hope  that your e-visit has been valuable and will speed your recovery

## 2018-10-22 ENCOUNTER — Encounter: Payer: Self-pay | Admitting: Family Medicine

## 2018-10-22 DIAGNOSIS — B001 Herpesviral vesicular dermatitis: Secondary | ICD-10-CM

## 2018-10-23 MED ORDER — ACYCLOVIR 400 MG PO TABS: ORAL_TABLET | ORAL | 3 refills | Status: DC

## 2018-11-04 DIAGNOSIS — M9903 Segmental and somatic dysfunction of lumbar region: Secondary | ICD-10-CM | POA: Diagnosis not present

## 2018-11-04 DIAGNOSIS — M5136 Other intervertebral disc degeneration, lumbar region: Secondary | ICD-10-CM | POA: Diagnosis not present

## 2018-11-04 DIAGNOSIS — M50322 Other cervical disc degeneration at C5-C6 level: Secondary | ICD-10-CM | POA: Diagnosis not present

## 2018-11-04 DIAGNOSIS — M9901 Segmental and somatic dysfunction of cervical region: Secondary | ICD-10-CM | POA: Diagnosis not present

## 2018-11-05 ENCOUNTER — Encounter: Payer: Self-pay | Admitting: Family Medicine

## 2018-11-07 DIAGNOSIS — M9903 Segmental and somatic dysfunction of lumbar region: Secondary | ICD-10-CM | POA: Diagnosis not present

## 2018-11-07 DIAGNOSIS — M9901 Segmental and somatic dysfunction of cervical region: Secondary | ICD-10-CM | POA: Diagnosis not present

## 2018-11-07 DIAGNOSIS — M50322 Other cervical disc degeneration at C5-C6 level: Secondary | ICD-10-CM | POA: Diagnosis not present

## 2018-11-07 DIAGNOSIS — M5136 Other intervertebral disc degeneration, lumbar region: Secondary | ICD-10-CM | POA: Diagnosis not present

## 2018-11-10 DIAGNOSIS — M5136 Other intervertebral disc degeneration, lumbar region: Secondary | ICD-10-CM | POA: Diagnosis not present

## 2018-11-10 DIAGNOSIS — M50322 Other cervical disc degeneration at C5-C6 level: Secondary | ICD-10-CM | POA: Diagnosis not present

## 2018-11-10 DIAGNOSIS — M9903 Segmental and somatic dysfunction of lumbar region: Secondary | ICD-10-CM | POA: Diagnosis not present

## 2018-11-10 DIAGNOSIS — M9901 Segmental and somatic dysfunction of cervical region: Secondary | ICD-10-CM | POA: Diagnosis not present

## 2018-11-12 DIAGNOSIS — M9901 Segmental and somatic dysfunction of cervical region: Secondary | ICD-10-CM | POA: Diagnosis not present

## 2018-11-12 DIAGNOSIS — M50322 Other cervical disc degeneration at C5-C6 level: Secondary | ICD-10-CM | POA: Diagnosis not present

## 2018-11-12 DIAGNOSIS — M5136 Other intervertebral disc degeneration, lumbar region: Secondary | ICD-10-CM | POA: Diagnosis not present

## 2018-11-12 DIAGNOSIS — M9903 Segmental and somatic dysfunction of lumbar region: Secondary | ICD-10-CM | POA: Diagnosis not present

## 2018-11-14 DIAGNOSIS — M50322 Other cervical disc degeneration at C5-C6 level: Secondary | ICD-10-CM | POA: Diagnosis not present

## 2018-11-14 DIAGNOSIS — M5136 Other intervertebral disc degeneration, lumbar region: Secondary | ICD-10-CM | POA: Diagnosis not present

## 2018-11-14 DIAGNOSIS — M9901 Segmental and somatic dysfunction of cervical region: Secondary | ICD-10-CM | POA: Diagnosis not present

## 2018-11-14 DIAGNOSIS — M9903 Segmental and somatic dysfunction of lumbar region: Secondary | ICD-10-CM | POA: Diagnosis not present

## 2018-11-17 DIAGNOSIS — M50322 Other cervical disc degeneration at C5-C6 level: Secondary | ICD-10-CM | POA: Diagnosis not present

## 2018-11-17 DIAGNOSIS — M9903 Segmental and somatic dysfunction of lumbar region: Secondary | ICD-10-CM | POA: Diagnosis not present

## 2018-11-17 DIAGNOSIS — M5136 Other intervertebral disc degeneration, lumbar region: Secondary | ICD-10-CM | POA: Diagnosis not present

## 2018-11-17 DIAGNOSIS — M9901 Segmental and somatic dysfunction of cervical region: Secondary | ICD-10-CM | POA: Diagnosis not present

## 2018-11-19 ENCOUNTER — Encounter: Payer: Self-pay | Admitting: Family Medicine

## 2018-11-19 DIAGNOSIS — M9901 Segmental and somatic dysfunction of cervical region: Secondary | ICD-10-CM | POA: Diagnosis not present

## 2018-11-19 DIAGNOSIS — M9903 Segmental and somatic dysfunction of lumbar region: Secondary | ICD-10-CM | POA: Diagnosis not present

## 2018-11-19 DIAGNOSIS — M5136 Other intervertebral disc degeneration, lumbar region: Secondary | ICD-10-CM | POA: Diagnosis not present

## 2018-11-19 DIAGNOSIS — M50322 Other cervical disc degeneration at C5-C6 level: Secondary | ICD-10-CM | POA: Diagnosis not present

## 2018-11-20 DIAGNOSIS — M9901 Segmental and somatic dysfunction of cervical region: Secondary | ICD-10-CM | POA: Diagnosis not present

## 2018-11-20 DIAGNOSIS — M50322 Other cervical disc degeneration at C5-C6 level: Secondary | ICD-10-CM | POA: Diagnosis not present

## 2018-11-20 DIAGNOSIS — M5136 Other intervertebral disc degeneration, lumbar region: Secondary | ICD-10-CM | POA: Diagnosis not present

## 2018-11-20 DIAGNOSIS — M9903 Segmental and somatic dysfunction of lumbar region: Secondary | ICD-10-CM | POA: Diagnosis not present

## 2018-11-20 MED ORDER — ACYCLOVIR 5 % EX OINT: 1.0000 "application " | TOPICAL_OINTMENT | CUTANEOUS | 1 refills | Status: DC

## 2018-11-26 DIAGNOSIS — M5136 Other intervertebral disc degeneration, lumbar region: Secondary | ICD-10-CM | POA: Diagnosis not present

## 2018-11-26 DIAGNOSIS — M50322 Other cervical disc degeneration at C5-C6 level: Secondary | ICD-10-CM | POA: Diagnosis not present

## 2018-11-26 DIAGNOSIS — M9901 Segmental and somatic dysfunction of cervical region: Secondary | ICD-10-CM | POA: Diagnosis not present

## 2018-11-26 DIAGNOSIS — M9903 Segmental and somatic dysfunction of lumbar region: Secondary | ICD-10-CM | POA: Diagnosis not present

## 2018-11-27 DIAGNOSIS — M9903 Segmental and somatic dysfunction of lumbar region: Secondary | ICD-10-CM | POA: Diagnosis not present

## 2018-11-27 DIAGNOSIS — M50322 Other cervical disc degeneration at C5-C6 level: Secondary | ICD-10-CM | POA: Diagnosis not present

## 2018-11-27 DIAGNOSIS — M5136 Other intervertebral disc degeneration, lumbar region: Secondary | ICD-10-CM | POA: Diagnosis not present

## 2018-11-27 DIAGNOSIS — M9901 Segmental and somatic dysfunction of cervical region: Secondary | ICD-10-CM | POA: Diagnosis not present

## 2018-11-28 DIAGNOSIS — M9901 Segmental and somatic dysfunction of cervical region: Secondary | ICD-10-CM | POA: Diagnosis not present

## 2018-11-28 DIAGNOSIS — M9903 Segmental and somatic dysfunction of lumbar region: Secondary | ICD-10-CM | POA: Diagnosis not present

## 2018-11-28 DIAGNOSIS — M5136 Other intervertebral disc degeneration, lumbar region: Secondary | ICD-10-CM | POA: Diagnosis not present

## 2018-11-28 DIAGNOSIS — M50322 Other cervical disc degeneration at C5-C6 level: Secondary | ICD-10-CM | POA: Diagnosis not present

## 2018-12-01 DIAGNOSIS — M9901 Segmental and somatic dysfunction of cervical region: Secondary | ICD-10-CM | POA: Diagnosis not present

## 2018-12-01 DIAGNOSIS — M9903 Segmental and somatic dysfunction of lumbar region: Secondary | ICD-10-CM | POA: Diagnosis not present

## 2018-12-01 DIAGNOSIS — M5136 Other intervertebral disc degeneration, lumbar region: Secondary | ICD-10-CM | POA: Diagnosis not present

## 2018-12-01 DIAGNOSIS — M50322 Other cervical disc degeneration at C5-C6 level: Secondary | ICD-10-CM | POA: Diagnosis not present

## 2018-12-02 DIAGNOSIS — M9901 Segmental and somatic dysfunction of cervical region: Secondary | ICD-10-CM | POA: Diagnosis not present

## 2018-12-02 DIAGNOSIS — M5136 Other intervertebral disc degeneration, lumbar region: Secondary | ICD-10-CM | POA: Diagnosis not present

## 2018-12-02 DIAGNOSIS — M9903 Segmental and somatic dysfunction of lumbar region: Secondary | ICD-10-CM | POA: Diagnosis not present

## 2018-12-02 DIAGNOSIS — M50322 Other cervical disc degeneration at C5-C6 level: Secondary | ICD-10-CM | POA: Diagnosis not present

## 2018-12-05 DIAGNOSIS — M9903 Segmental and somatic dysfunction of lumbar region: Secondary | ICD-10-CM | POA: Diagnosis not present

## 2018-12-05 DIAGNOSIS — M50322 Other cervical disc degeneration at C5-C6 level: Secondary | ICD-10-CM | POA: Diagnosis not present

## 2018-12-05 DIAGNOSIS — M5136 Other intervertebral disc degeneration, lumbar region: Secondary | ICD-10-CM | POA: Diagnosis not present

## 2018-12-05 DIAGNOSIS — M9901 Segmental and somatic dysfunction of cervical region: Secondary | ICD-10-CM | POA: Diagnosis not present

## 2018-12-08 DIAGNOSIS — M9901 Segmental and somatic dysfunction of cervical region: Secondary | ICD-10-CM | POA: Diagnosis not present

## 2018-12-08 DIAGNOSIS — M50322 Other cervical disc degeneration at C5-C6 level: Secondary | ICD-10-CM | POA: Diagnosis not present

## 2018-12-08 DIAGNOSIS — M9903 Segmental and somatic dysfunction of lumbar region: Secondary | ICD-10-CM | POA: Diagnosis not present

## 2018-12-08 DIAGNOSIS — M5136 Other intervertebral disc degeneration, lumbar region: Secondary | ICD-10-CM | POA: Diagnosis not present

## 2018-12-12 DIAGNOSIS — M50322 Other cervical disc degeneration at C5-C6 level: Secondary | ICD-10-CM | POA: Diagnosis not present

## 2018-12-12 DIAGNOSIS — M9901 Segmental and somatic dysfunction of cervical region: Secondary | ICD-10-CM | POA: Diagnosis not present

## 2018-12-12 DIAGNOSIS — M5136 Other intervertebral disc degeneration, lumbar region: Secondary | ICD-10-CM | POA: Diagnosis not present

## 2018-12-12 DIAGNOSIS — M9903 Segmental and somatic dysfunction of lumbar region: Secondary | ICD-10-CM | POA: Diagnosis not present

## 2018-12-15 DIAGNOSIS — M50322 Other cervical disc degeneration at C5-C6 level: Secondary | ICD-10-CM | POA: Diagnosis not present

## 2018-12-15 DIAGNOSIS — M9901 Segmental and somatic dysfunction of cervical region: Secondary | ICD-10-CM | POA: Diagnosis not present

## 2018-12-15 DIAGNOSIS — M5136 Other intervertebral disc degeneration, lumbar region: Secondary | ICD-10-CM | POA: Diagnosis not present

## 2018-12-15 DIAGNOSIS — M9903 Segmental and somatic dysfunction of lumbar region: Secondary | ICD-10-CM | POA: Diagnosis not present

## 2018-12-19 DIAGNOSIS — M5136 Other intervertebral disc degeneration, lumbar region: Secondary | ICD-10-CM | POA: Diagnosis not present

## 2018-12-19 DIAGNOSIS — M9901 Segmental and somatic dysfunction of cervical region: Secondary | ICD-10-CM | POA: Diagnosis not present

## 2018-12-19 DIAGNOSIS — M50322 Other cervical disc degeneration at C5-C6 level: Secondary | ICD-10-CM | POA: Diagnosis not present

## 2018-12-19 DIAGNOSIS — M9903 Segmental and somatic dysfunction of lumbar region: Secondary | ICD-10-CM | POA: Diagnosis not present

## 2018-12-23 DIAGNOSIS — M9903 Segmental and somatic dysfunction of lumbar region: Secondary | ICD-10-CM | POA: Diagnosis not present

## 2018-12-23 DIAGNOSIS — M5136 Other intervertebral disc degeneration, lumbar region: Secondary | ICD-10-CM | POA: Diagnosis not present

## 2018-12-23 DIAGNOSIS — M9901 Segmental and somatic dysfunction of cervical region: Secondary | ICD-10-CM | POA: Diagnosis not present

## 2018-12-23 DIAGNOSIS — M50322 Other cervical disc degeneration at C5-C6 level: Secondary | ICD-10-CM | POA: Diagnosis not present

## 2018-12-26 DIAGNOSIS — M9901 Segmental and somatic dysfunction of cervical region: Secondary | ICD-10-CM | POA: Diagnosis not present

## 2018-12-26 DIAGNOSIS — M9903 Segmental and somatic dysfunction of lumbar region: Secondary | ICD-10-CM | POA: Diagnosis not present

## 2018-12-26 DIAGNOSIS — M50322 Other cervical disc degeneration at C5-C6 level: Secondary | ICD-10-CM | POA: Diagnosis not present

## 2018-12-26 DIAGNOSIS — M5136 Other intervertebral disc degeneration, lumbar region: Secondary | ICD-10-CM | POA: Diagnosis not present

## 2018-12-29 DIAGNOSIS — M5136 Other intervertebral disc degeneration, lumbar region: Secondary | ICD-10-CM | POA: Diagnosis not present

## 2018-12-29 DIAGNOSIS — M50322 Other cervical disc degeneration at C5-C6 level: Secondary | ICD-10-CM | POA: Diagnosis not present

## 2018-12-29 DIAGNOSIS — M9903 Segmental and somatic dysfunction of lumbar region: Secondary | ICD-10-CM | POA: Diagnosis not present

## 2018-12-29 DIAGNOSIS — M9901 Segmental and somatic dysfunction of cervical region: Secondary | ICD-10-CM | POA: Diagnosis not present

## 2019-01-01 DIAGNOSIS — M5136 Other intervertebral disc degeneration, lumbar region: Secondary | ICD-10-CM | POA: Diagnosis not present

## 2019-01-01 DIAGNOSIS — M9901 Segmental and somatic dysfunction of cervical region: Secondary | ICD-10-CM | POA: Diagnosis not present

## 2019-01-01 DIAGNOSIS — M9903 Segmental and somatic dysfunction of lumbar region: Secondary | ICD-10-CM | POA: Diagnosis not present

## 2019-01-01 DIAGNOSIS — M50322 Other cervical disc degeneration at C5-C6 level: Secondary | ICD-10-CM | POA: Diagnosis not present

## 2019-01-08 DIAGNOSIS — M5136 Other intervertebral disc degeneration, lumbar region: Secondary | ICD-10-CM | POA: Diagnosis not present

## 2019-01-08 DIAGNOSIS — M9903 Segmental and somatic dysfunction of lumbar region: Secondary | ICD-10-CM | POA: Diagnosis not present

## 2019-01-08 DIAGNOSIS — M50322 Other cervical disc degeneration at C5-C6 level: Secondary | ICD-10-CM | POA: Diagnosis not present

## 2019-01-08 DIAGNOSIS — M9901 Segmental and somatic dysfunction of cervical region: Secondary | ICD-10-CM | POA: Diagnosis not present

## 2019-01-22 ENCOUNTER — Ambulatory Visit (INDEPENDENT_AMBULATORY_CARE_PROVIDER_SITE_OTHER): Payer: 59 | Admitting: Family Medicine

## 2019-01-22 ENCOUNTER — Other Ambulatory Visit: Payer: Self-pay

## 2019-01-22 ENCOUNTER — Encounter: Payer: Self-pay | Admitting: Family Medicine

## 2019-01-22 VITALS — BP 113/71 | HR 84 | Temp 97.1°F | Wt 113.6 lb

## 2019-01-22 DIAGNOSIS — Z23 Encounter for immunization: Secondary | ICD-10-CM | POA: Diagnosis not present

## 2019-01-22 DIAGNOSIS — H6121 Impacted cerumen, right ear: Secondary | ICD-10-CM | POA: Diagnosis not present

## 2019-01-22 DIAGNOSIS — F419 Anxiety disorder, unspecified: Secondary | ICD-10-CM

## 2019-01-22 DIAGNOSIS — M26621 Arthralgia of right temporomandibular joint: Secondary | ICD-10-CM

## 2019-01-22 DIAGNOSIS — R636 Underweight: Secondary | ICD-10-CM | POA: Insufficient documentation

## 2019-01-22 NOTE — Assessment & Plan Note (Signed)
Well controlled Continue Klonopin prn

## 2019-01-22 NOTE — Assessment & Plan Note (Signed)
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

## 2019-01-22 NOTE — Assessment & Plan Note (Signed)
Ear flushed at end of visit Not contributing to pain

## 2019-01-22 NOTE — Patient Instructions (Signed)

## 2019-01-22 NOTE — Progress Notes (Signed)
Patient: Catherine Arnold Female    DOB: 1951-02-25   68 y.o.   MRN: [id number] Visit Date: 01/22/2019  Today's Provider: Lavon Paganini, MD   Chief Complaint  Patient presents with  . Jaw Pain   Subjective:    I, [name] CMA, am acting as a scribe for Lavon Paganini, MD.   HPI    Jaw Pain Patient states presents today for jaw pain for about 6 months. Patient states that it have been some swelling. Patient states that she eats some on that side. She has been to two different dentists and they have found nothing. She can not open her mouth wide.  Gives headaches and ear pain.  Wearing a night guard. Tried chiropractor - feels a bit better but doesn't last long.  Sleeping on her back helps some with muscle relaxation.  Feels it sublux at times    Allergies  Allergen Reactions  . Bee Venom Anaphylaxis  . Paclitaxel Shortness Of Breath and Anaphylaxis    Other reaction(s): ANAPHYLAXIS Other reaction(s): SHORTNESS OF BREATH  . Prochlorperazine Maleate Other (See Comments)    Other reaction(s): Unknown reaction  . Oxycodone-Acetaminophen     Other reaction(s): NAUSEA  . Sulfa Antibiotics      Current Outpatient Medications:  .  acyclovir (ZOVIRAX) 400 MG tablet, Take 1 tablet by mouth 3  times a day for 5 days as  needed, Disp: 30 tablet, Rfl: 3 .  acyclovir ointment (ZOVIRAX) 5 %, Apply 1 application topically every 3 (three) hours., Disp: 30 g, Rfl: 1 .  albuterol (PROVENTIL HFA;VENTOLIN HFA) 108 (90 Base) MCG/ACT inhaler, Inhale 2 puffs every 6 (six) hours as needed into the lungs., Disp: 1 Inhaler, Rfl: 0 .  citalopram (CELEXA) 20 MG tablet, Take 1.5 tablets (30 mg total) by mouth daily., Disp: 135 tablet, Rfl: 3 .  clonazePAM (KLONOPIN) 1 MG tablet, TAKE 1 TABLET BY MOUTH AT  BEDTIME, Disp: 90 tablet, Rfl: 1 .  fluticasone (FLONASE) 50 MCG/ACT nasal spray, USE 2 SPRAYS IN EACH  NOSTRIL DAILY, Disp: 48 g, Rfl: 3  Review of Systems  Social History    Tobacco Use  . Smoking status: Former Smoker    Quit date: 01/20/1992    Years since quitting: 27.0  . Smokeless tobacco: Never Used  Substance Use Topics  . Alcohol use: Yes    Alcohol/week: 7.0 standard drinks    Types: 7 Cans of beer per week      Objective:   BP 113/71 (BP Location: Left Arm, Patient Position: Sitting, Cuff Size: Normal)   Pulse 84   Temp (!) 97.1 F (36.2 C) (Temporal)   Wt 113 lb 9.6 oz (51.5 kg)   SpO2 98%   BMI 18.34 kg/m  Vitals:   01/22/19 1038  BP: 113/71  Pulse: 84  Temp: (!) 97.1 F (36.2 C)  TempSrc: Temporal  SpO2: 98%  Weight: 113 lb 9.6 oz (51.5 kg)  Body mass index is 18.34 kg/m.   Physical Exam Vitals signs reviewed.  Constitutional:      General: She is not in acute distress.    Appearance: Normal appearance.  HENT:     Head: Normocephalic and atraumatic.     Right Ear: Ear canal and external ear normal. There is impacted cerumen.  Eyes:     General: No scleral icterus.    Conjunctiva/sclera: Conjunctivae normal.  Cardiovascular:     Rate and Rhythm: Normal rate and regular rhythm.  Heart sounds: Normal heart sounds. No murmur.  Pulmonary:     Effort: Pulmonary effort is normal. No respiratory distress.     Breath sounds: Normal breath sounds. No wheezing.  Musculoskeletal:     Comments: TTP over R TMJ with sliding of the joint with opening and closing of the jaw  Neurological:     Mental Status: She is alert and oriented to person, place, and time. Mental status is at baseline.  Psychiatric:        Mood and Affect: Mood normal.        Behavior: Behavior normal.      No results found for any visits on 01/22/19.     Assessment & Plan   Problem List Items Addressed This Visit      Nervous and Auditory   Impacted cerumen of right ear    Ear flushed at end of visit Not contributing to pain        Musculoskeletal and Integument   Arthralgia of right temporomandibular joint - Primary    Pain and some  subluxation of R TMJ Discussed NSAIDs Patient declines muscle relaxant Continue chiropracotr Referral to OMFS for further eval and management      Relevant Orders   Ambulatory referral to Oral Maxillofacial Surgery     Other   Anxiety    Well controlled Continue Klonopin prn      Underweight    Discussed importance of ideal weight  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   The entirety of the information documented in the History of Present Illness, Review of Systems and Physical Exam were personally obtained by me. Portions of this information were initially documented by [facility], CMA and reviewed by me for thoroughness and accuracy.    Bacigalupo, Dionne Bucy, MD MPH [facility]

## 2019-01-22 NOTE — Assessment & Plan Note (Signed)
Pain and some subluxation of R TMJ Discussed NSAIDs Patient declines muscle relaxant Continue chiropracotr Referral to OMFS for further eval and management

## 2019-01-23 ENCOUNTER — Encounter: Payer: Self-pay | Admitting: Family Medicine

## 2019-02-02 ENCOUNTER — Encounter: Payer: Self-pay | Admitting: Family Medicine

## 2019-02-02 DIAGNOSIS — B001 Herpesviral vesicular dermatitis: Secondary | ICD-10-CM

## 2019-02-02 MED ORDER — ACYCLOVIR 400 MG PO TABS: ORAL_TABLET | ORAL | 3 refills | Status: DC

## 2019-02-05 ENCOUNTER — Encounter: Payer: Self-pay | Admitting: Family Medicine

## 2019-03-02 ENCOUNTER — Other Ambulatory Visit: Payer: Self-pay | Admitting: Dentistry

## 2019-03-02 DIAGNOSIS — M26633 Articular disc disorder of bilateral temporomandibular joint: Secondary | ICD-10-CM

## 2019-03-04 ENCOUNTER — Ambulatory Visit
Admission: RE | Admit: 2019-03-04 | Discharge: 2019-03-04 | Disposition: A | Discharge status: Home / Self Care | Payer: 59 | Source: Ambulatory Visit | Attending: Dentistry | Admitting: Dentistry

## 2019-03-04 DIAGNOSIS — M26633 Articular disc disorder of bilateral temporomandibular joint: Secondary | ICD-10-CM

## 2019-03-04 DIAGNOSIS — M26632 Articular disc disorder of left temporomandibular joint: Secondary | ICD-10-CM | POA: Diagnosis not present

## 2019-03-04 DIAGNOSIS — M26631 Articular disc disorder of right temporomandibular joint: Secondary | ICD-10-CM | POA: Diagnosis not present

## 2019-03-15 ENCOUNTER — Other Ambulatory Visit: Payer: 59

## 2019-03-28 ENCOUNTER — Other Ambulatory Visit: Payer: Self-pay | Admitting: Family Medicine

## 2019-03-28 DIAGNOSIS — F339 Major depressive disorder, recurrent, unspecified: Secondary | ICD-10-CM

## 2019-06-01 ENCOUNTER — Other Ambulatory Visit: Payer: Self-pay | Admitting: Family Medicine

## 2019-06-01 DIAGNOSIS — J302 Other seasonal allergic rhinitis: Secondary | ICD-10-CM

## 2019-06-15 ENCOUNTER — Ambulatory Visit (INDEPENDENT_AMBULATORY_CARE_PROVIDER_SITE_OTHER): Payer: Medicare Other | Admitting: Family Medicine

## 2019-06-15 ENCOUNTER — Ambulatory Visit
Admission: RE | Admit: 2019-06-15 | Discharge: 2019-06-15 | Disposition: A | Discharge status: Home / Self Care | Payer: Medicare Other | Source: Ambulatory Visit | Attending: Family Medicine | Admitting: Family Medicine

## 2019-06-15 ENCOUNTER — Encounter: Payer: Self-pay | Admitting: Family Medicine

## 2019-06-15 ENCOUNTER — Other Ambulatory Visit: Payer: Self-pay

## 2019-06-15 ENCOUNTER — Ambulatory Visit
Admission: RE | Admit: 2019-06-15 | Discharge: 2019-06-15 | Disposition: A | Discharge status: Home / Self Care | Payer: Medicare Other | Attending: Family Medicine | Admitting: Family Medicine

## 2019-06-15 VITALS — BP 115/74 | HR 70 | Temp 96.6°F | Wt 114.0 lb

## 2019-06-15 DIAGNOSIS — M26621 Arthralgia of right temporomandibular joint: Secondary | ICD-10-CM

## 2019-06-15 DIAGNOSIS — E559 Vitamin D deficiency, unspecified: Secondary | ICD-10-CM

## 2019-06-15 DIAGNOSIS — M7061 Trochanteric bursitis, right hip: Secondary | ICD-10-CM | POA: Diagnosis not present

## 2019-06-15 DIAGNOSIS — M8580 Other specified disorders of bone density and structure, unspecified site: Secondary | ICD-10-CM | POA: Diagnosis not present

## 2019-06-15 DIAGNOSIS — Z853 Personal history of malignant neoplasm of breast: Secondary | ICD-10-CM | POA: Insufficient documentation

## 2019-06-15 DIAGNOSIS — M25551 Pain in right hip: Secondary | ICD-10-CM | POA: Diagnosis not present

## 2019-06-15 DIAGNOSIS — Z780 Asymptomatic menopausal state: Secondary | ICD-10-CM

## 2019-06-15 DIAGNOSIS — B001 Herpesviral vesicular dermatitis: Secondary | ICD-10-CM | POA: Diagnosis not present

## 2019-06-15 DIAGNOSIS — Z0000 Encounter for general adult medical examination without abnormal findings: Secondary | ICD-10-CM | POA: Diagnosis not present

## 2019-06-15 DIAGNOSIS — R636 Underweight: Secondary | ICD-10-CM

## 2019-06-15 MED ORDER — LIDOCAINE VISCOUS HCL 2 % MT SOLN: 15.0000 mL | OROMUCOSAL | 1 refills | Status: DC | PRN

## 2019-06-15 MED ORDER — VALACYCLOVIR HCL 1 G PO TABS: 2000.0000 mg | ORAL_TABLET | Freq: Two times a day (BID) | ORAL | 3 refills | Status: AC

## 2019-06-15 NOTE — Assessment & Plan Note (Signed)
Chronic and intermittent Discussed that if she continues to have several episodes per year, could consider daily suppressive therapy When she does get the first sense of a cold sore coming on, she can start Valtrex treatment Discussed using this instead of acyclovir, given ease of dosing Can continue viscous lidocaine as needed for pain

## 2019-06-15 NOTE — Assessment & Plan Note (Signed)
Need to repeat DEXA scan Continue daily calcium and vitamin D supplementation Given history of chemotherapy and endocrine therapy for breast cancer, will consider endocrine referral for treatment options if this has progressed

## 2019-06-15 NOTE — Assessment & Plan Note (Signed)
Recheck vitamin D level Advise calcium and vitamin D supplementation for osteopenia

## 2019-06-15 NOTE — Assessment & Plan Note (Signed)
History and exam is consistent with right trochanteric bursitis Given her history of breast cancer, will obtain x-ray to ensure no bony lesions, however Discussed home exercise program and gave exercises Discussed options with patient and decided to proceed with corticosteroid injection into the bursa during the visit today Discussed return precautions See procedure note below

## 2019-06-15 NOTE — Patient Instructions (Signed)
Preventive Care 68 Years and Older, Female Preventive care refers to lifestyle choices and visits with your health care provider that can promote health and wellness. This includes:  A yearly physical exam. This is also called an annual well check.  Regular dental and eye exams.  Immunizations.  Screening for certain conditions.  Healthy lifestyle choices, such as diet and exercise. What can I expect for my preventive care visit? Physical exam Your health care provider will check:  Height and weight. These may be used to calculate body mass index (BMI), which is a measurement that tells if you are at a healthy weight.  Heart rate and blood pressure.  Your skin for abnormal spots. Counseling Your health care provider may ask you questions about:  Alcohol, tobacco, and drug use.  Emotional well-being.  Home and relationship well-being.  Sexual activity.  Eating habits.  History of falls.  Memory and ability to understand (cognition).  Work and work Statistician.  Pregnancy and menstrual history. What immunizations do I need?  Influenza (flu) vaccine  This is recommended every year. Tetanus, diphtheria, and pertussis (Tdap) vaccine  You may need a Td booster every 10 years. Varicella (chickenpox) vaccine  You may need this vaccine if you have not already been vaccinated. Zoster (shingles) vaccine  You may need this after age 68. Pneumococcal conjugate (PCV13) vaccine  One dose is recommended after age 68. Pneumococcal polysaccharide (PPSV23) vaccine  One dose is recommended after age 72. Measles, mumps, and rubella (MMR) vaccine  You may need at least one dose of MMR if you were born in 1957 or later. You may also need a second dose. Meningococcal conjugate (MenACWY) vaccine  You may need this if you have certain conditions. Hepatitis A vaccine  You may need this if you have certain conditions or if you travel or work in places where you may be exposed  to hepatitis A. Hepatitis B vaccine  You may need this if you have certain conditions or if you travel or work in places where you may be exposed to hepatitis B. Haemophilus influenzae type b (Hib) vaccine  You may need this if you have certain conditions. You may receive vaccines as individual doses or as more than one vaccine together in one shot (combination vaccines). Talk with your health care provider about the risks and benefits of combination vaccines. What tests do I need? Blood tests  Lipid and cholesterol levels. These may be checked every 5 years, or more frequently depending on your overall health.  Hepatitis C test.  Hepatitis B test. Screening  Lung cancer screening. You may have this screening every year starting at age 68 if you have a 30-pack-year history of smoking and currently smoke or have quit within the past 15 years.  Colorectal cancer screening. All adults should have this screening starting at age 68 and continuing until age 15. Your health care provider may recommend screening at age 68 if you are at increased risk. You will have tests every 1-10 years, depending on your results and the type of screening test.  Diabetes screening. This is done by checking your blood sugar (glucose) after you have not eaten for a while (fasting). You may have this done every 1-3 years.  Mammogram. This may be done every 1-2 years. Talk with your health care provider about how often you should have regular mammograms.  BRCA-related cancer screening. This may be done if you have a family history of breast, ovarian, tubal, or peritoneal cancers.  Other tests  Sexually transmitted disease (STD) testing.  Bone density scan. This is done to screen for osteoporosis. You may have this done starting at age 68. Follow these instructions at home: Eating and drinking  Eat a diet that includes fresh fruits and vegetables, whole grains, lean protein, and low-fat dairy products. Limit  your intake of foods with high amounts of sugar, saturated fats, and salt.  Take vitamin and mineral supplements as recommended by your health care provider.  Do not drink alcohol if your health care provider tells you not to drink.  If you drink alcohol: ? Limit how much you have to 0-1 drink a day. ? Be aware of how much alcohol is in your drink. In the [place] one drink equals one 12 oz bottle of beer (355 mL), one 5 oz glass of wine (148 mL), or one 1 oz glass of hard liquor (44 mL). Lifestyle  Take daily care of your teeth and gums.  Stay active. Exercise for at least 30 minutes on 5 or more days each week.  Do not use any products that contain nicotine or tobacco, such as cigarettes, e-cigarettes, and chewing tobacco. If you need help quitting, ask your health care provider.  If you are sexually active, practice safe sex. Use a condom or other form of protection in order to prevent STIs (sexually transmitted infections).  Talk with your health care provider about taking a low-dose aspirin or statin. What's next?  Go to your health care provider once a year for a well check visit.  Ask your health care provider how often you should have your eyes and teeth checked.  Stay up to date on all vaccines. This information is not intended to replace advice given to you by your health care provider. Make sure you discuss any questions you have with your health care provider. Document Revised: 05/01/2018 Document Reviewed: 05/01/2018 Elsevier Patient Education  2020 [employer].

## 2019-06-15 NOTE — Assessment & Plan Note (Signed)
Discussed importance of ideal weight Improving slightly now that she is able to chew better after getting her TMJ under control

## 2019-06-15 NOTE — Progress Notes (Signed)
Patient: Catherine Arnold, Female    DOB: 12/25/1950, 69 y.o.   MRN: [id number] Visit Date: 06/15/2019  Today's Provider: Lavon Paganini, MD   Chief Complaint  Patient presents with  . Annual Exam   Subjective:     Complete Physical Catherine Arnold is a 69 y.o. female. She feels well.  Still having trouble with right hip and leg pain.   She reports exercising regularly. She reports she is sleeping well.  ----------------------------------------------------------- R lateral hip pain: TTP, difficulty sleeping on it. Tried stretching. Stays active.  No medications.  Was evaluated with bone scan for this previously.  She continues to have intermittent cold sores.  She has run out of acyclovir ointment and states that it was expensive through the pharmacy.  She uses viscous lidocaine as needed.  She has never taken Valtrex.  She has been followed by [facility] for significant right TMJ.  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  She has gotten custom mouthguard made for day and night which help with the pain.  She is no longer in daily pain from this.  She is able to chew better as well.  Review of Systems  Constitutional: Negative.   Eyes: Negative.   Respiratory: Negative.   Cardiovascular: Negative.   Gastrointestinal: Negative.   Endocrine: Negative.   Genitourinary: Negative.   Musculoskeletal: Positive for arthralgias and neck pain. Negative for back pain, gait problem, joint swelling, myalgias and neck stiffness.  Skin: Negative.   Allergic/Immunologic: Negative.   Neurological: Negative.  Negative for dizziness, light-headedness and headaches.  Hematological: Negative.   Psychiatric/Behavioral: Negative.     Social History   Socioeconomic History  . Marital status: Married    Spouse name: Not on file  . Number of children: Not on file  . Years of education: Not on file  . Highest education level: Not on file  Occupational History  . Not on  file  Tobacco Use  . Smoking status: Former Smoker    Quit date: 01/20/1992    Years since quitting: 27.4  . Smokeless tobacco: Never Used  Substance and Sexual Activity  . Alcohol use: Yes    Alcohol/week: 7.0 standard drinks    Types: 7 Cans of beer per week  . Drug use: No  . Sexual activity: Not on file  Other Topics Concern  . Not on file  Social History Narrative  . Not on file   Social Determinants of Health   Financial Resource Strain:   . Difficulty of Paying Living Expenses: Not on file  Food Insecurity:   . Worried About Charity fundraiser in the Last Year: Not on file  . Ran Out of Food in the Last Year: Not on file  Transportation Needs:   . Lack of Transportation (Medical): Not on file  . Lack of Transportation (Non-Medical): Not on file  Physical Activity:   . Days of Exercise per Week: Not on file  . Minutes of Exercise per Session: Not on file  Stress:   . Feeling of Stress : Not on file  Social Connections:   . Frequency of Communication with Friends and Family: Not on file  . Frequency of Social Gatherings with Friends and Family: Not on file  . Attends Religious Services: Not on file  . Active Member of Clubs or Organizations: Not on file  . Attends Archivist Meetings: Not on file  . Marital Status: Not on  file  Intimate Partner Violence:   . Fear of Current or Ex-Partner: Not on file  . Emotionally Abused: Not on file  . Physically Abused: Not on file  . Sexually Abused: Not on file    Past Medical History:  Diagnosis Date  . Cancer [facility])      Patient Active Problem List   Diagnosis Date Noted  . Trochanteric bursitis of right hip 06/15/2019  . Underweight 01/22/2019  . Arthralgia of right temporomandibular joint 01/22/2019  . Allergic rhinitis 02/01/2015  . H/O malignant neoplasm of female breast 02/01/2015  . 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 H/O alcohol abuse 02/01/2015  . Osteopenia 02/01/2015  . 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 Avitaminosis D 02/01/2015  . Anxiety 10/22/2014    Past Surgical History:  Procedure Laterality Date  . APPENDECTOMY    . BUNIONECTOMY  2006  . ELBOW SURGERY Bilateral   . KNEE SURGERY Bilateral 2014   meniscus tear  . MASTECTOMY Bilateral 10/19/2008  . TONSILLECTOMY      Her family history includes Dementia in her mother; Seizures in her sister.   Current Outpatient Medications:  .  citalopram (CELEXA) 20 MG tablet, TAKE 1 AND 1/2 TABLETS BY  MOUTH DAILY, Disp: 135 tablet, Rfl: 3 .  clonazePAM (KLONOPIN) 1 MG tablet, TAKE 1 TABLET BY MOUTH AT  BEDTIME, Disp: 90 tablet, Rfl: 1 .  fluticasone (FLONASE) 50 MCG/ACT nasal spray, USE 2 SPRAYS IN EACH  NOSTRIL DAILY, Disp: 48 g, Rfl: 3 .  albuterol (PROVENTIL HFA;VENTOLIN HFA) 108 (90 Base) MCG/ACT inhaler, Inhale 2 puffs every 6 (six) hours as needed into the lungs. (Patient not taking: Reported on 06/15/2019), Disp: 1 Inhaler, Rfl: 0 .  lidocaine (XYLOCAINE) 2 % solution, Use as directed 15 mLs in the mouth or throat as needed for mouth pain., Disp: 100 mL, Rfl: 1 .  valACYclovir (VALTREX) 1000 MG tablet, Take 2 tablets (2,000 mg total) by mouth 2 (two) times daily for 1 day., Disp: 4 tablet, Rfl: 3  Patient Care Team: Virginia Crews, MD as PCP - General (Family Medicine)     Objective:    Vitals: BP 115/74 (BP Location: Left Arm, Patient Position: Sitting, Cuff Size: Normal)   Pulse 70   Temp (!) 96.6 F (35.9 C) (Temporal)   Wt 114 lb (51.7 kg)   BMI 18.40 kg/m   Physical Exam Vitals reviewed.  Constitutional:      General: She is not in acute distress.    Appearance: Normal appearance. She is well-developed. She is not diaphoretic.  HENT:     Head: Normocephalic and atraumatic.     Right Ear: Tympanic membrane, ear canal and external ear normal.     Left Ear: Tympanic membrane, ear canal and external ear normal.  Eyes:     General: No scleral icterus.    Conjunctiva/sclera: Conjunctivae normal.     Pupils:  Pupils are equal, round, and reactive to light.  Neck:     Thyroid: No thyromegaly.  Cardiovascular:     Rate and Rhythm: Normal rate and regular rhythm.     Heart sounds: Normal heart sounds. No murmur.  Pulmonary:     Effort: Pulmonary effort is normal. No respiratory distress.     Breath sounds: Normal breath sounds. No wheezing or rales.  Abdominal:     General: There is no distension.     Palpations: Abdomen is soft.     Tenderness: There is no abdominal tenderness. There is  no guarding or rebound.  Musculoskeletal:        General: No deformity.     Cervical back: Neck supple.     Right lower leg: No edema.     Left lower leg: No edema.     Comments: Right hip: Range of motion is intact.  No pain with internal or external rotation.  Tenderness to palpation over right greater trochanter.  Tenderness palpation and tightness down the IT band.  Strength and sensation are intact.  Able to bear weight and walk more than 4 steps.  Gait intact.  Lymphadenopathy:     Cervical: No cervical adenopathy.  Skin:    General: Skin is warm and dry.     Capillary Refill: Capillary refill takes less than 2 seconds.     Findings: No rash.  Neurological:     Mental Status: She is alert and oriented to person, place, and time. Mental status is at baseline.  Psychiatric:        Mood and Affect: Mood normal.        Behavior: Behavior normal.        Thought Content: Thought content normal.     Activities of Daily Living In your present state of health, do you have any difficulty performing the following activities: 06/15/2019  Hearing? N  Vision? N  Difficulty concentrating or making decisions? N  Walking or climbing stairs? N  Dressing or bathing? N  Doing errands, shopping? N  Some recent data might be hidden    Fall Risk Assessment Fall Risk  06/11/2018 03/07/2017  Falls in the past year? 1 No  Number falls in past yr: 0 -  Injury with Fall? 1 -     Depression Screen PHQ 2/9 Scores  06/15/2019 06/11/2018 03/07/2017  PHQ - 2 Score 0 1 0  PHQ- 9 Score 0 3 -    No flowsheet data found.     Assessment & Plan:    Annual Physical Reviewed patient's Family Medical History Reviewed and updated list of patient's medical providers Assessment of cognitive impairment was done Assessed patient's functional ability Established a written schedule for health screening [place] Completed and Reviewed  Exercise Activities and Dietary recommendations Goals   None     Immunization History  Administered Date(s) Administered  . Fluad Quad(high Dose 65+) 01/22/2019  . Influenza, High Dose Seasonal PF 03/07/2017, 02/22/2018  . Influenza,inj,Quad PF,6+ Mos 03/26/2015  . Influenza-Unspecified 02/09/2016  . Pneumococcal Conjugate-13 03/07/2017  . Pneumococcal Polysaccharide-23 06/11/2018  . Td 06/11/2018  . Zoster 02/27/2012  . Zoster Recombinat (Shingrix) 06/14/2017, 08/21/2017    Health Maintenance  Topic Date Due  . Fecal DNA (Cologuard)  04/05/2020  . TETANUS/TDAP  06/11/2028  . INFLUENZA VACCINE  Completed  . DEXA SCAN  Completed  . Hepatitis C Screening  Completed  . PNA vac Low Risk Adult  Completed     Discussed health benefits of physical activity, and encouraged her to engage in regular exercise appropriate for her age and condition.    ------------------------------------------------------------------------------------------------------------  Problem List Items Addressed This Visit      Digestive   Herpes labialis    Chronic and intermittent Discussed that if she continues to have several episodes per year, could consider daily suppressive therapy When she does get the first sense of a cold sore coming on, she can start Valtrex treatment Discussed using this instead of acyclovir, given ease of dosing Can continue viscous lidocaine as needed for pain  Relevant Medications   valACYclovir (VALTREX) 1000 MG tablet      Musculoskeletal and Integument   Osteopenia    Need to repeat DEXA scan Continue daily calcium and vitamin D supplementation Given history of chemotherapy and endocrine therapy for breast cancer, will consider endocrine referral for treatment options if this has progressed      Relevant Orders   DG Bone Density   Arthralgia of right temporomandibular joint    Significant improvement Continue working with OMFS      Trochanteric bursitis of right hip    History and exam is consistent with right trochanteric bursitis Given her history of breast cancer, will obtain x-ray to ensure no bony lesions, however Discussed home exercise program and gave exercises Discussed options with patient and decided to proceed with corticosteroid injection into the bursa during the visit today Discussed return precautions See procedure note below      Relevant Orders   DG Hip Unilat W OR W/O Pelvis 2-3 Views Right     Other   Avitaminosis D    Recheck vitamin D level Advise calcium and vitamin D supplementation for osteopenia      Relevant Orders   VITAMIN D 25 Hydroxy (Vit-D Deficiency, Fractures)   Underweight    Discussed importance of ideal weight Improving slightly now that she is able to chew better after getting her TMJ under control       Other Visit Diagnoses    Encounter for annual wellness visit (AWV) in Medicare patient    -  Primary   Relevant Orders   CBC w/Diff/Platelet   Lipid panel   Comprehensive metabolic panel   Hemoglobin A1c   DG Bone Density   VITAMIN D 25 Hydroxy (Vit-D Deficiency, Fractures)   History of breast cancer       Relevant Orders   DG Bone Density   DG Hip Unilat W OR W/O Pelvis 2-3 Views Right   Postmenopausal       Relevant Orders   DG Bone Density      INJECTION: Patient was given informed consent,. Appropriate time out was taken. Area prepped and draped in usual sterile fashion. 2 cc of depo-medrol 40 mg/ml plus  4 cc of 1% lidocaine without  epinephrine was injected into the R greater trochanteric bursa using a(n) lateral approach. The patient tolerated the procedure well. There were no complications. Post procedure instructions were given.    Return in about 1 year (around 06/14/2020) for [place].   The entirety of the information documented in the History of Present Illness, Review of Systems and Physical Exam were personally obtained by me. Portions of this information were initially documented by Ashley Royalty, CMA and reviewed by me for thoroughness and accuracy.    Elvyn Krohn, Dionne Bucy, MD MPH [facility]

## 2019-06-15 NOTE — Assessment & Plan Note (Signed)
Significant improvement Continue working with OMFS

## 2019-06-16 ENCOUNTER — Encounter: Payer: Self-pay | Admitting: Family Medicine

## 2019-06-16 ENCOUNTER — Telehealth: Payer: Self-pay

## 2019-06-16 LAB — COMPREHENSIVE METABOLIC PANEL
ALT: 12 IU/L (ref 0–32)
AST: 25 IU/L (ref 0–40)
Albumin/Globulin Ratio: 2 (ref 1.2–2.2)
Albumin: 4.7 g/dL (ref 3.8–4.8)
Alkaline Phosphatase: 47 IU/L (ref 39–117)
BUN/Creatinine Ratio: 10 — ABNORMAL LOW (ref 12–28)
BUN: 8 mg/dL (ref 8–27)
Bilirubin Total: 0.4 mg/dL (ref 0.0–1.2)
CO2: 22 mmol/L (ref 20–29)
Calcium: 9.4 mg/dL (ref 8.7–10.3)
Chloride: 96 mmol/L (ref 96–106)
Creatinine, Ser: 0.84 mg/dL (ref 0.57–1.00)
GFR calc Af Amer: 83 mL/min/{1.73_m2} (ref >59)
GFR calc non Af Amer: 72 mL/min/{1.73_m2} (ref >59)
Globulin, Total: 2.4 g/dL (ref 1.5–4.5)
Glucose: 91 mg/dL (ref 65–99)
Potassium: 4 mmol/L (ref 3.5–5.2)
Sodium: 135 mmol/L (ref 134–144)
Total Protein: 7.1 g/dL (ref 6.0–8.5)

## 2019-06-16 LAB — CBC WITH DIFFERENTIAL/PLATELET
Basophils Absolute: 0 10*3/uL (ref 0.0–0.2)
Basos: 1 %
EOS (ABSOLUTE): 0.1 10*3/uL (ref 0.0–0.4)
Eos: 3 %
Hematocrit: 38.3 % (ref 34.0–46.6)
Hemoglobin: 13.2 g/dL (ref 11.1–15.9)
Immature Grans (Abs): 0 10*3/uL (ref 0.0–0.1)
Immature Granulocytes: 0 %
Lymphocytes Absolute: 1.4 10*3/uL (ref 0.7–3.1)
Lymphs: 35 %
MCH: 30.8 pg (ref 26.6–33.0)
MCHC: 34.5 g/dL (ref 31.5–35.7)
MCV: 89 fL (ref 79–97)
Monocytes Absolute: 0.4 10*3/uL (ref 0.1–0.9)
Monocytes: 10 %
Neutrophils Absolute: 2.1 10*3/uL (ref 1.4–7.0)
Neutrophils: 51 %
Platelets: 296 10*3/uL (ref 150–450)
RBC: 4.29 x10E6/uL (ref 3.77–5.28)
RDW: 12.6 % (ref 11.7–15.4)
WBC: 4.1 10*3/uL (ref 3.4–10.8)

## 2019-06-16 LAB — LIPID PANEL
Chol/HDL Ratio: 2 ratio (ref 0.0–4.4)
Cholesterol, Total: 195 mg/dL (ref 100–199)
HDL: 97 mg/dL (ref >39)
LDL Chol Calc (NIH): 88 mg/dL (ref 0–99)
Triglycerides: 53 mg/dL (ref 0–149)
VLDL Cholesterol Cal: 10 mg/dL (ref 5–40)

## 2019-06-16 LAB — HEMOGLOBIN A1C
Est. average glucose Bld gHb Est-mCnc: 120 mg/dL
Hgb A1c MFr Bld: 5.8 % — ABNORMAL HIGH (ref 4.8–5.6)

## 2019-06-16 LAB — VITAMIN D 25 HYDROXY (VIT D DEFICIENCY, FRACTURES): Vit D, 25-Hydroxy: 23.5 ng/mL — ABNORMAL LOW (ref 30.0–100.0)

## 2019-06-16 NOTE — Telephone Encounter (Signed)
-----   Message from Virginia Crews, MD sent at 06/15/2019  3:49 PM EST ----- Normal x-ray with no fracture and no bone lesions

## 2019-06-16 NOTE — Telephone Encounter (Signed)
    Result Notes and Comments to Patient Comment seen by patient Sharyn Blitz on 06/15/2019 5:00 PM EST

## 2019-06-17 ENCOUNTER — Telehealth: Payer: Self-pay

## 2019-06-17 NOTE — Telephone Encounter (Signed)
Patient advised as below. Patient reports she is not taking vitamin D supplement.

## 2019-06-17 NOTE — Telephone Encounter (Signed)
Patient advised as below.  

## 2019-06-17 NOTE — Telephone Encounter (Signed)
Start vitamin D3 2000 units daily

## 2019-06-17 NOTE — Telephone Encounter (Signed)
-----   Message from Virginia Crews, MD sent at 06/16/2019  8:24 AM EST ----- Normal labs, except Hemoglobin A1c, 3 month avg of blood sugars, is in prediabetic range.  In order to prevent progression to diabetes, recommend low carb diet and regular exercise.  Vit D is low, as well.  Taking daily supplement?

## 2019-09-07 ENCOUNTER — Other Ambulatory Visit: Payer: Self-pay | Admitting: Family Medicine

## 2019-09-07 DIAGNOSIS — F419 Anxiety disorder, unspecified: Secondary | ICD-10-CM

## 2019-09-07 NOTE — Telephone Encounter (Signed)
Requested medications are due for refill today?  Yes - This medication refill cannot be delegated.    Requested medications are on active medication list?  Yes  Last Refill:   07/28/2018   # 90 with one refill   Future visit scheduled?  No   Notes to Clinic:  This medication refill cannot be delegated.

## 2019-09-18 ENCOUNTER — Ambulatory Visit: Payer: 59 | Admitting: Family Medicine

## 2019-10-07 ENCOUNTER — Encounter: Payer: Self-pay | Admitting: Family Medicine

## 2019-10-07 ENCOUNTER — Ambulatory Visit (INDEPENDENT_AMBULATORY_CARE_PROVIDER_SITE_OTHER): Payer: Medicare Other | Admitting: Family Medicine

## 2019-10-07 ENCOUNTER — Other Ambulatory Visit: Payer: Self-pay

## 2019-10-07 VITALS — BP 128/79 | HR 76 | Temp 96.9°F | Resp 16 | Wt 109.0 lb

## 2019-10-07 DIAGNOSIS — S86811A Strain of other muscle(s) and tendon(s) at lower leg level, right leg, initial encounter: Secondary | ICD-10-CM

## 2019-10-07 DIAGNOSIS — M8580 Other specified disorders of bone density and structure, unspecified site: Secondary | ICD-10-CM | POA: Diagnosis not present

## 2019-10-07 DIAGNOSIS — S86891A Other injury of other muscle(s) and tendon(s) at lower leg level, right leg, initial encounter: Secondary | ICD-10-CM

## 2019-10-07 MED ORDER — DICLOFENAC SODIUM 1 % EX GEL: 4.0000 g | Freq: Four times a day (QID) | CUTANEOUS | 1 refills | Status: DC

## 2019-10-07 NOTE — Progress Notes (Signed)
Established patient visit   Patient: Catherine Arnold   DOB: 06/30/1950   69 y.o. Female  MRN: [id number] Visit Date: 10/07/2019  Today's healthcare provider: Lavon Paganini, MD   Chief Complaint  Patient presents with  . Leg Pain   Subjective    HPI  Leg Pain  The patient complains of right sides lateral leg pain Symptoms started 1 month ago Pain is located on the lateral aspect of the right anterior lower leg Pain is described at a deep ache Symptoms improve throughout the day Pt is taking ibuprofen and tylenol PRN for pain and states that medication helps her symptoms Pt is also elevating and icing location of pain with some relief Symptoms worsen at night and keep the patient from sleeping throughout the night Pt denies radiation of pain, joint pain, joint swelling Pt states that she has been traveling and playing in many golf tournaments within the last month Pt has a past medical history of breast cancer and is concerned today if symptoms of leg pain is related to malignancy Pt has a past medical history of osteopenia and vitamin D deficiency Pt recently made changes to diet to cut out carbs in order to reduce inflammation and reduce A1C Pt states she has difficulty finding food to replace calories from eating less carbs because she does not typically have a large appetite  Pt denies recent trauma, difficulty walking, difficulty balancing, numbness, tingling, cold extremity, changes in skin color to the extremities, fatigue, weight loss, changes in urination   ------------------------------------------------------------------------------------     Past Surgical History:  Procedure Laterality Date  . APPENDECTOMY    . BUNIONECTOMY  2006  . ELBOW SURGERY Bilateral   . KNEE SURGERY Bilateral 2014   meniscus tear  . MASTECTOMY Bilateral 10/19/2008  . TONSILLECTOMY     Social History   Tobacco Use  . Smoking status: Former Smoker    Quit date: 01/20/1992      Years since quitting: 27.7  . Smokeless tobacco: Never Used  Substance Use Topics  . Alcohol use: Yes    Alcohol/week: 7.0 standard drinks    Types: 7 Cans of beer per week  . Drug use: No   Social History   Socioeconomic History  . Marital status: Married    Spouse name: Not on file  . Number of children: Not on file  . Years of education: Not on file  . Highest education level: Not on file  Occupational History  . Not on file  Tobacco Use  . Smoking status: Former Smoker    Quit date: 01/20/1992    Years since quitting: 27.7  . Smokeless tobacco: Never Used  Substance and Sexual Activity  . Alcohol use: Yes    Alcohol/week: 7.0 standard drinks    Types: 7 Cans of beer per week  . Drug use: No  . Sexual activity: Not on file  Other Topics Concern  . Not on file  Social History Narrative  . Not on file   Social Determinants of Health   Financial Resource Strain:   . Difficulty of Paying Living Expenses:   Food Insecurity:   . Worried About Charity fundraiser in the Last Year:   . Arboriculturist in the Last Year:   Transportation Needs:   . Film/video editor (Medical):   Marland Kitchen Lack of Transportation (Non-Medical):   Physical Activity:   . Days of Exercise per Week:   . Minutes of  Exercise per Session:   Stress:   . Feeling of Stress :   Social Connections:   . Frequency of Communication with Friends and Family:   . Frequency of Social Gatherings with Friends and Family:   . Attends Religious Services:   . Active Member of Clubs or Organizations:   . Attends Archivist Meetings:   Marland Kitchen Marital Status:   Intimate Partner Violence:   . Fear of Current or Ex-Partner:   . Emotionally Abused:   Marland Kitchen Physically Abused:   . Sexually Abused:        Medications: Outpatient Medications Prior to Visit  Medication Sig  . acyclovir (ZOVIRAX) 400 MG tablet Take 400 mg by mouth 3 (three) times daily.  . citalopram (CELEXA) 20 MG tablet TAKE 1 AND 1/2  TABLETS BY  MOUTH DAILY  . clonazePAM (KLONOPIN) 1 MG tablet TAKE 1 TABLET BY MOUTH AT  BEDTIME  . fluticasone (FLONASE) 50 MCG/ACT nasal spray USE 2 SPRAYS IN EACH  NOSTRIL DAILY  . lidocaine (XYLOCAINE) 2 % solution Use as directed 15 mLs in the mouth or throat as needed for mouth pain.  Marland Kitchen albuterol (PROVENTIL HFA;VENTOLIN HFA) 108 (90 Base) MCG/ACT inhaler Inhale 2 puffs every 6 (six) hours as needed into the lungs. (Patient not taking: Reported on 06/15/2019)   No facility-administered medications prior to visit.    Review of Systems  Constitutional: Negative.   HENT: Negative.   Eyes: Negative.   Respiratory: Negative.   Cardiovascular: Negative.   Gastrointestinal: Negative.   Endocrine: Negative.   Genitourinary: Negative.   Musculoskeletal: Positive for myalgias.  Skin: Negative.   Allergic/Immunologic: Negative.   Neurological: Negative.   Hematological: Negative.   Psychiatric/Behavioral: Negative.        Objective    BP 128/79 (BP Location: Left Arm, Patient Position: Sitting, Cuff Size: Normal)   Pulse 76   Temp (!) 96.9 F (36.1 C) (Temporal)   Resp 16   Wt 109 lb (49.4 kg)   BMI 17.59 kg/m  BP Readings from Last 3 Encounters:  10/07/19 128/79  06/15/19 115/74  01/22/19 113/71   Wt Readings from Last 3 Encounters:  10/07/19 109 lb (49.4 kg)  06/15/19 114 lb (51.7 kg)  01/22/19 113 lb 9.6 oz (51.5 kg)      Physical Exam Constitutional:      Appearance: Normal appearance.  HENT:     Head: Normocephalic and atraumatic.  Cardiovascular:     Rate and Rhythm: Normal rate and regular rhythm.     Pulses: Normal pulses.     Heart sounds: Normal heart sounds.  Pulmonary:     Effort: Pulmonary effort is normal.     Breath sounds: Normal breath sounds.  Musculoskeletal:     Cervical back: Neck supple.     Right upper leg: Normal. No swelling, edema, deformity, lacerations, tenderness or bony tenderness.     Left upper leg: Normal. No swelling, edema,  deformity, lacerations, tenderness or bony tenderness.     Right knee: Normal. No swelling, deformity, effusion, erythema, ecchymosis, lacerations, bony tenderness or crepitus. Normal range of motion. No tenderness. Normal alignment.     Left knee: Normal. No swelling, deformity, effusion, erythema, ecchymosis, lacerations, bony tenderness or crepitus. Normal range of motion. No tenderness. Normal alignment.     Right lower leg: Tenderness (to palpation over the distribution of the tibialis anterior muscle) and bony tenderness (sharp pain to palpation over leading edge of tibia) present. No swelling, deformity or lacerations.  No edema.     Left lower leg: No swelling, deformity, lacerations, tenderness or bony tenderness. No edema.     Right ankle: Normal. No swelling, deformity, ecchymosis or lacerations. No tenderness. Normal range of motion. Normal pulse.     Right Achilles Tendon: Normal. No tenderness or defects. Thompson's test negative.     Left ankle: Normal. No swelling, deformity, ecchymosis or lacerations. No tenderness. Normal range of motion. Normal pulse.     Left Achilles Tendon: Normal. No tenderness or defects. Thompson's test negative.  Lymphadenopathy:     Cervical: No cervical adenopathy.  Skin:    General: Skin is warm and dry.     Findings: No bruising or lesion.  Neurological:     General: No focal deficit present.     Mental Status: She is alert and oriented to person, place, and time.     Motor: Motor function is intact. No weakness, tremor, atrophy or abnormal muscle tone.     Gait: Gait normal.     Deep Tendon Reflexes:     Reflex Scores:      Patellar reflexes are 2+ on the right side and 2+ on the left side.      Achilles reflexes are 2+ on the right side and 2+ on the left side. Psychiatric:        Mood and Affect: Mood normal.        Behavior: Behavior normal.      No results found for any visits on 10/07/19.  Assessment & Plan     1. Right medial  tibial stress syndrome / Strain of right tibialis anterior muscle Pt with 1 month history of symptoms of right sided lateral lower leg pain Pt with recent increase in activity level given participation in golf tournaments Symptoms and physical exam are consistent with right tibial stress syndrome with right tibialis anterior strain Pt has a PMH of breast cancer and malignancy should be ruled out Pt has a PMH of osteopenia and fracture should be ruled out  Plan: Discussed with pt the strategies of maintaining caloric input while reducing carbs from diet Continue taking vitamin D supplement Discussed with pt the importance of rest to allow muscle and bone to heal Will get X-ray of lower leg  Will start using voltaren gel for symptom relief Will get a DEXA to evaluate bone density given pt with past medical history of osteopenia   2. Osteopenia, unspecified location Pt with finding of osteopenia on dexa scan completed in March 2014 Pt with vitamin D deficiency seen on labs in January 2021 Pt with no past fragility fractures Pt with symptoms of right sided lateral lower leg pain today  Plan: Discussed with pt the strategies of maintaining caloric input while reducing carbs from diet Continue taking vitamin D supplement Will get X-ray of lower leg  Will get a DEXA to evaluate bone density   Return if symptoms worsen or fail to improve.     Chipper Herb, Medical Student [facility] [phone] (phone) [phone] (fax)  [place]

## 2019-10-07 NOTE — Progress Notes (Signed)
See note from Chipper Herb, medical student, attested by me from same date of service  Auden Tatar, Dionne Bucy, MD, MPH [place]

## 2019-10-07 NOTE — Patient Instructions (Signed)
Shin Splints  Shin splints is a painful condition that is felt either on the bone that is located in the front of the lower leg (tibia or shin bone) or in the muscles on either side of the bone. This condition happens when physical activities lead to inflammation of the muscles, tendons, and the thin layer of tissue that covers the shin bone. It may result from participating in sports or other intense exercise. What are the causes? This condition may be caused by:  Overuse of muscles.  Repetitive activities.  Flat feet or rigid arches. Activities that could contribute to shin splints include:  Having a sudden increase in exercise time.  Starting a new, intense activity.  Running up hills or long distances.  Playing sports that involve sudden starts and stops.  Not warming up before activity.  Wearing old or worn-out shoes. What are the signs or symptoms? The main symptom of this condition is pain that occurs:  On the front of the lower leg.  In the muscles on either side of the shin bone.  While exercising or at rest. How is this diagnosed? This condition may be diagnosed based on:  A physical exam.  Your symptoms.  An observation of you while you are walking or running.  X-rays or other imaging tests. These may be done to rule out other problems. How is this treated? Treatment for this condition depends on your age, history, overall health, and how bad the pain is. Most cases of shin splints can be managed by doing one or more of the following:  Resting.  Reducing the length and intensity of your exercise.  Stopping the activity that causes shin pain.  Taking medicines to control the inflammation.  Icing, massaging, stretching, and strengthening the affected area.  Wearing shoes that have rigid heels, shock absorption, and a good arch support. For severe shin pain, your health care provider may recommend that you use crutches to avoid putting weight on your  legs. Follow these instructions at home: Medicines  Take over-the-counter and prescription medicines only as told by your health care provider.  Do not drive or use heavy machinery while taking prescription pain medicine. Managing pain, stiffness, and swelling      If directed, apply ice to the painful area. Icing can help to relieve pain and swelling. ? Put ice in a plastic bag. ? Place a towel between your skin and the bag. ? Leave the ice on for 20 minutes, 2-3 times a day.  If directed, apply heat to the painful area before stretching exercises, or as told by your health care provider. Heat can help to relax your muscles. Use the heat source that your health care provider recommends, such as a moist heat pack or a heating pad. ? Place a towel between your skin and the heat source. ? Leave the heat on for 20-30 minutes. ? Remove the heat if your skin turns bright red. This is especially important if you are unable to feel pain, heat, or cold. You may have a greater risk of getting burned.  Massage, stretch, and strengthen the affected area as directed by your health care provider.  Wear compression sleeves or socks as told by your health care provider.  Raise (elevate) your legs above the level of your heart while you are sitting or lying down. Activity  Rest as needed. Return to activity gradually as told by your health care provider.  When you start exercising again, begin with non-weight-bearing exercises,  such as cycling or swimming.  Stop running if the pain returns.  Warm up properly before exercising.  Run on a surface that is level and fairly firm.  Gradually change the intensity of an exercise.  If you increase your running distance, add only 5-10% to your distance each week. This means that if you are running 5 miles this week, you should only increase your run by - mile for next week. General instructions  Wear shoes that have rigid heels, shock absorption,  and a good arch support.  Change your athletic shoes every 6 months, or every 350-450 miles.  Keep all follow-up visits as told by your health care provider. This is important. Contact a health care provider if:  Your symptoms continue, or they worsen even after treatment.  The location, intensity, or type of pain changes over time.  You have swelling in your lower leg that gets worse.  Your shin becomes red and feels warm. Get help right away if:  You have severe pain.  You have trouble walking. Summary  Shin splints happens when physical activities lead to inflammation of the muscles, tendons, and the thin layer of tissue that covers the shin bone.  Treatments may include medicines, resting, and icing.  Return to activity gradually as directed by your health care provider.  Make sure you know what symptoms should cause you to contact your health care provider. This information is not intended to replace advice given to you by your health care provider. Make sure you discuss any questions you have with your health care provider. Document Revised: 05/27/2017 Document Reviewed: 05/27/2017 Elsevier Patient Education  [place].

## 2019-10-08 ENCOUNTER — Encounter: Payer: Self-pay | Admitting: Family Medicine

## 2019-10-08 ENCOUNTER — Ambulatory Visit: Payer: 59 | Admitting: Family Medicine

## 2019-10-09 ENCOUNTER — Encounter: Payer: Self-pay | Admitting: Family Medicine

## 2019-10-12 ENCOUNTER — Other Ambulatory Visit: Payer: Self-pay | Admitting: Family Medicine

## 2019-10-12 DIAGNOSIS — J302 Other seasonal allergic rhinitis: Secondary | ICD-10-CM

## 2019-10-12 MED ORDER — FLUTICASONE PROPIONATE 50 MCG/ACT NA SUSP: 2.0000 | Freq: Every day | NASAL | 3 refills | Status: DC

## 2019-10-12 NOTE — Telephone Encounter (Signed)
[facility] faxed refill request for the following medications:  fluticasone (FLONASE) 50 MCG/ACT nasal spray    Please advise.

## 2019-12-05 ENCOUNTER — Encounter: Payer: Self-pay | Admitting: Family Medicine

## 2019-12-05 DIAGNOSIS — B379 Candidiasis, unspecified: Secondary | ICD-10-CM

## 2019-12-07 MED ORDER — FLUCONAZOLE 150 MG PO TABS: 150.0000 mg | ORAL_TABLET | Freq: Once | ORAL | 1 refills | Status: AC

## 2019-12-28 ENCOUNTER — Encounter: Payer: Self-pay | Admitting: Family Medicine

## 2019-12-28 MED ORDER — ACYCLOVIR 5 % EX OINT
1.0000 | TOPICAL_OINTMENT | CUTANEOUS | 5 refills | Status: DC
Start: 2019-12-28 — End: 2020-07-04

## 2019-12-31 ENCOUNTER — Encounter: Payer: Self-pay | Admitting: Family Medicine

## 2020-01-02 ENCOUNTER — Encounter: Payer: Self-pay | Admitting: Family Medicine

## 2020-01-05 MED ORDER — VALACYCLOVIR HCL 1 G PO TABS: 1000.0000 mg | ORAL_TABLET | Freq: Every day | ORAL | 5 refills | Status: AC

## 2020-01-29 ENCOUNTER — Encounter: Payer: Self-pay | Admitting: Family Medicine

## 2020-02-11 ENCOUNTER — Other Ambulatory Visit: Payer: Self-pay

## 2020-02-11 ENCOUNTER — Ambulatory Visit (INDEPENDENT_AMBULATORY_CARE_PROVIDER_SITE_OTHER): Payer: Medicare Other

## 2020-02-11 DIAGNOSIS — Z23 Encounter for immunization: Secondary | ICD-10-CM | POA: Diagnosis not present

## 2020-02-14 ENCOUNTER — Other Ambulatory Visit: Payer: Self-pay | Admitting: Family Medicine

## 2020-02-14 DIAGNOSIS — F339 Major depressive disorder, recurrent, unspecified: Secondary | ICD-10-CM

## 2020-02-17 ENCOUNTER — Ambulatory Visit: Payer: Medicare Other

## 2020-02-22 ENCOUNTER — Encounter: Payer: Self-pay | Admitting: Family Medicine

## 2020-02-25 ENCOUNTER — Encounter: Payer: Self-pay | Admitting: Family Medicine

## 2020-02-25 ENCOUNTER — Telehealth (INDEPENDENT_AMBULATORY_CARE_PROVIDER_SITE_OTHER): Payer: 59 | Admitting: Physician Assistant

## 2020-02-25 DIAGNOSIS — J029 Acute pharyngitis, unspecified: Secondary | ICD-10-CM | POA: Diagnosis not present

## 2020-02-25 DIAGNOSIS — R0600 Dyspnea, unspecified: Secondary | ICD-10-CM | POA: Diagnosis not present

## 2020-02-25 NOTE — Progress Notes (Signed)
MyChart Video Visit    Virtual Visit via Video Note   This visit type was conducted due to national recommendations for restrictions regarding the COVID-19 Pandemic (e.g. social distancing) in an effort to limit this patient's exposure and mitigate transmission in our community. This patient is at least at moderate risk for complications without adequate follow up. This format is felt to be most appropriate for this patient at this time. Physical exam was limited by quality of the video and audio technology used for the visit.   Patient location: Home Provider location: Office   I discussed the limitations of evaluation and management by telemedicine and the availability of in person appointments. The patient expressed understanding and agreed to proceed.  Patient: Catherine Arnold   DOB: [birth year]   69 y.o. Female  MRN: [id number] Visit Date: 02/25/2020  Today's healthcare provider: Trinna Post, PA-C   Chief Complaint  Patient presents with  . Sinusitis   Subjective    HPI   Patient reports having sore throat, fever, and swollen glands since this past Saturday 02/20/2020. She reports getting tested on 02/21/2020 for COVID and it was negative. She has a minor cough. She is reporting fatigue. She has been vaccinated against COVID since last February. She denies wheezing and SOB. She denies myalgias, vomiting. Denies sick contacts.     Medications: Outpatient Medications Prior to Visit  Medication Sig  . acyclovir (ZOVIRAX) 400 MG tablet Take 400 mg by mouth 3 (three) times daily.  Marland Kitchen acyclovir ointment (ZOVIRAX) 5 % Apply 1 application topically every 3 (three) hours.  Marland Kitchen albuterol (PROVENTIL HFA;VENTOLIN HFA) 108 (90 Base) MCG/ACT inhaler Inhale 2 puffs every 6 (six) hours as needed into the lungs.  . citalopram (CELEXA) 20 MG tablet TAKE 1 AND 1/2 TABLETS BY  MOUTH DAILY  . clonazePAM (KLONOPIN) 1 MG tablet TAKE 1 TABLET BY MOUTH AT  BEDTIME  . diclofenac Sodium (VOLTAREN)  1 % GEL Apply 4 g topically 4 (four) times daily.  . fluticasone (FLONASE) 50 MCG/ACT nasal spray Place 2 sprays into both nostrils daily.  Marland Kitchen lidocaine (XYLOCAINE) 2 % solution Use as directed 15 mLs in the mouth or throat as needed for mouth pain.   No facility-administered medications prior to visit.    Review of Systems  Constitutional: Negative.   HENT: Positive for congestion, postnasal drip, rhinorrhea, sinus pressure and sinus pain. Negative for ear discharge, sneezing, sore throat, tinnitus, trouble swallowing and voice change.   Gastrointestinal: Negative.   Allergic/Immunologic: Positive for environmental allergies.  Neurological: Negative for dizziness, light-headedness and headaches.      Objective    There were no vitals taken for this visit.   Physical Exam Constitutional:      Appearance: Normal appearance.  Pulmonary:     Effort: Pulmonary effort is normal. No respiratory distress.  Neurological:     Mental Status: She is alert.  Psychiatric:        Mood and Affect: Mood normal.        Behavior: Behavior normal.        Assessment & Plan    1. Sore throat  - symptoms and exam c/w viral URI  - no evidence of strep pharyngitis, CAP, AOM, bacterial sinusitis, or other bacterial infection - concern for possible COVID19 infection - will send for outpatient testing, patient may have tested too early.  - discussed need to quarantine 10 days from start of symptoms and until fever-free for at least 48  hours - discussed need to quarantine household members - discussed symptomatic management, natural course, and return precautions  - Novel Coronavirus, NAA ([facility])  2. PND (paroxysmal nocturnal dyspnea)  - Novel Coronavirus, NAA ([facility])  No follow-ups on file.     I discussed the assessment and treatment plan with the patient. The patient was provided an opportunity to ask questions and all were answered. The patient agreed with the plan and demonstrated  an understanding of the instructions.   The patient was advised to call back or seek an in-person evaluation if the symptoms worsen or if the condition fails to improve as anticipated.   ITrinna Post, PA-C, have reviewed all documentation for this visit. The documentation on 02/25/20 for the exam, diagnosis, procedures, and orders are all accurate and complete.  The entirety of the information documented in the History of Present Illness, Review of Systems and Physical Exam were personally obtained by me. Portions of this information were initially documented by Wilburt Finlay, CMA and reviewed by me for thoroughness and accuracy.    Paulene Floor [facility] [phone] (phone) [phone] (fax)  [place]

## 2020-02-27 LAB — NOVEL CORONAVIRUS, NAA: SARS-CoV-2, NAA: NOT DETECTED

## 2020-02-27 LAB — SARS-COV-2, NAA 2 DAY TAT

## 2020-02-29 ENCOUNTER — Encounter: Payer: Self-pay | Admitting: Physician Assistant

## 2020-03-02 ENCOUNTER — Encounter: Payer: Self-pay | Admitting: Physician Assistant

## 2020-03-11 ENCOUNTER — Encounter: Payer: Self-pay | Admitting: Family Medicine

## 2020-03-11 ENCOUNTER — Telehealth: Payer: Self-pay

## 2020-03-11 ENCOUNTER — Other Ambulatory Visit: Payer: Self-pay

## 2020-03-11 ENCOUNTER — Encounter: Payer: Self-pay | Admitting: Adult Health

## 2020-03-11 ENCOUNTER — Ambulatory Visit (INDEPENDENT_AMBULATORY_CARE_PROVIDER_SITE_OTHER): Payer: 59 | Admitting: Adult Health

## 2020-03-11 VITALS — BP 107/63 | HR 74 | Temp 98.4°F | Resp 16 | Wt 110.2 lb

## 2020-03-11 DIAGNOSIS — R82998 Other abnormal findings in urine: Secondary | ICD-10-CM

## 2020-03-11 DIAGNOSIS — R300 Dysuria: Secondary | ICD-10-CM | POA: Diagnosis not present

## 2020-03-11 LAB — POCT URINALYSIS DIPSTICK
Bilirubin, UA: NEGATIVE
Blood, UA: NEGATIVE
Glucose, UA: NEGATIVE
Nitrite, UA: NEGATIVE
Protein, UA: NEGATIVE
Spec Grav, UA: 1.025 (ref 1.010–1.025)
Urobilinogen, UA: 0.2 E.U./dL
pH, UA: 5 (ref 5.0–8.0)

## 2020-03-11 MED ORDER — CEPHALEXIN 500 MG PO CAPS: 500.0000 mg | ORAL_CAPSULE | Freq: Two times a day (BID) | ORAL | 0 refills | Status: DC

## 2020-03-11 NOTE — Telephone Encounter (Signed)
Can you see if someone can see her today?

## 2020-03-11 NOTE — Telephone Encounter (Signed)
Left message to schedule an appointment.  PEC please schedule her when she calls back.   Thanks,   -Mickel Baas

## 2020-03-11 NOTE — Patient Instructions (Signed)
Cephalexin Tablets or Capsules What is this medicine? CEPHALEXIN (sef a LEX in) is a cephalosporin antibiotic. It treats some infections caused by bacteria. It will not work for colds, the flu, or other viruses. This medicine may be used for other purposes; ask your health care provider or pharmacist if you have questions. COMMON BRAND NAME(S): Biocef, Daxbia, Keflex, Keftab What should I tell my health care provider before I take this medicine? They need to know if you have any of these conditions:  kidney disease  stomach or intestine problems, especially colitis  an unusual or allergic reaction to cephalexin, other cephalosporins, penicillins, other antibiotics, medicines, foods, dyes or preservatives  pregnant or trying to get pregnant  breast-feeding How should I use this medicine? Take this drug by mouth. Take it as directed on the prescription label at the same time every day. You can take it with or without food. If it upsets your stomach, take it with food. Take all of this drug unless your health care provider tells you to stop it early. Keep taking it even if you think you are better. Talk to your health care provider about the use of this drug in children. While it may be prescribed for selected conditions, precautions do apply. Overdosage: If you think you have taken too much of this medicine contact a poison control center or emergency room at once. NOTE: This medicine is only for you. Do not share this medicine with others. What if I miss a dose? If you miss a dose, take it as soon as you can. If it is almost time for your next dose, take only that dose. Do not take double or extra doses. What may interact with this medicine?  probenecid  some other antibiotics This list may not describe all possible interactions. Give your health care provider a list of all the medicines, herbs, non-prescription drugs, or dietary supplements you use. Also tell them if you smoke, drink  alcohol, or use illegal drugs. Some items may interact with your medicine. What should I watch for while using this medicine? Tell your doctor or health care provider if your symptoms do not begin to improve in a few days. This medicine may cause serious skin reactions. They can happen weeks to months after starting the medicine. Contact your health care provider right away if you notice fevers or flu-like symptoms with a rash. The rash may be red or purple and then turn into blisters or peeling of the skin. Or, you might notice a red rash with swelling of the face, lips or lymph nodes in your neck or under your arms. Do not treat diarrhea with over the counter products. Contact your doctor if you have diarrhea that lasts more than 2 days or if it is severe and watery. If you have diabetes, you may get a false-positive result for sugar in your urine. Check with your doctor or health care provider. What side effects may I notice from receiving this medicine? Side effects that you should report to your doctor or health care professional as soon as possible:  allergic reactions like skin rash, itching or hives, swelling of the face, lips, or tongue  breathing problems  pain or trouble passing urine  redness, blistering, peeling or loosening of the skin, including inside the mouth  severe or watery diarrhea  unusually weak or tired  yellowing of the eyes, skin Side effects that usually do not require medical attention (report to your doctor or health care professional   if they continue or are bothersome):  gas or heartburn  genital or anal irritation  headache  joint or muscle pain  nausea, vomiting This list may not describe all possible side effects. Call your doctor for medical advice about side effects. You may report side effects to FDA at 1-800-FDA-1088. Where should I keep my medicine? Keep out of the reach of children and pets. Store at room temperature between 20 and 25  degrees C (68 and 77 degrees F). Throw away any unused drug after the expiration date. NOTE: This sheet is a summary. It may not cover all possible information. If you have questions about this medicine, talk to your doctor, pharmacist, or health care provider.  2020 Elsevier/Gold Standard (2018-12-12 11:27:00) Urinary Tract Infection, Adult A urinary tract infection (UTI) is an infection of any part of the urinary tract. The urinary tract includes:  The kidneys.  The ureters.  The bladder.  The urethra. These organs make, store, and get rid of pee (urine) in the body. What are the causes? This is caused by germs (bacteria) in your genital area. These germs grow and cause swelling (inflammation) of your urinary tract. What increases the risk? You are more likely to develop this condition if:  You have a small, thin tube (catheter) to drain pee.  You cannot control when you pee or poop (incontinence).  You are female, and: ? You use these methods to prevent pregnancy:  A medicine that kills sperm (spermicide).  A device that blocks sperm (diaphragm). ? You have low levels of a female hormone (estrogen). ? You are pregnant.  You have genes that add to your risk.  You are sexually active.  You take antibiotic medicines.  You have trouble peeing because of: ? A prostate that is bigger than normal, if you are female. ? A blockage in the part of your body that drains pee from the bladder (urethra). ? A kidney stone. ? A nerve condition that affects your bladder (neurogenic bladder). ? Not getting enough to drink. ? Not peeing often enough.  You have other conditions, such as: ? Diabetes. ? A weak disease-fighting system (immune system). ? Sickle cell disease. ? Gout. ? Injury of the spine. What are the signs or symptoms? Symptoms of this condition include:  Needing to pee right away (urgently).  Peeing often.  Peeing small amounts often.  Pain or burning when  peeing.  Blood in the pee.  Pee that smells bad or not like normal.  Trouble peeing.  Pee that is cloudy.  Fluid coming from the vagina, if you are female.  Pain in the belly or lower back. Other symptoms include:  Throwing up (vomiting).  No urge to eat.  Feeling mixed up (confused).  Being tired and grouchy (irritable).  A fever.  Watery poop (diarrhea). How is this treated? This condition may be treated with:  Antibiotic medicine.  Other medicines.  Drinking enough water. Follow these instructions at home:  Medicines  Take over-the-counter and prescription medicines only as told by your doctor.  If you were prescribed an antibiotic medicine, take it as told by your doctor. Do not stop taking it even if you start to feel better. General instructions  Make sure you: ? Pee until your bladder is empty. ? Do not hold pee for a long time. ? Empty your bladder after sex. ? Wipe from front to back after pooping if you are a female. Use each tissue one time when you wipe.    Drink enough fluid to keep your pee pale yellow.  Keep all follow-up visits as told by your doctor. This is important. Contact a doctor if:  You do not get better after 1-2 days.  Your symptoms go away and then come back. Get help right away if:  You have very bad back pain.  You have very bad pain in your lower belly.  You have a fever.  You are sick to your stomach (nauseous).  You are throwing up. Summary  A urinary tract infection (UTI) is an infection of any part of the urinary tract.  This condition is caused by germs in your genital area.  There are many risk factors for a UTI. These include having a small, thin tube to drain pee and not being able to control when you pee or poop.  Treatment includes antibiotic medicines for germs.  Drink enough fluid to keep your pee pale yellow. This information is not intended to replace advice given to you by your health care  provider. Make sure you discuss any questions you have with your health care provider. Document Revised: 04/24/2018 Document Reviewed: 11/14/2017 Elsevier Patient Education  2020 Elsevier Inc.  

## 2020-03-11 NOTE — Progress Notes (Signed)
Established patient visit   Patient: Catherine Arnold   DOB: [birth year]   69 y.o. Female  MRN: [id number] Visit Date: 03/11/2020  Today's healthcare provider: Marcille Buffy, FNP   Chief Complaint  Patient presents with  . Dysuria   Subjective    dys  Dysuria  This is a new problem. The current episode started in the past 7 days (3 days ). The problem occurs every urination. The problem has been unchanged. The quality of the pain is described as burning. There has been no fever. She is not sexually active. There is no history of pyelonephritis. Associated symptoms include frequency and hesitancy. Pertinent negatives include no chills, discharge, flank pain, hematuria, nausea, possible pregnancy, sweats, urgency or vomiting. She has tried increased fluids (tried ketoconazole she says cream without relief. ) for the symptoms. The treatment provided no relief.   She has burning with urination,  denies any vaginal sores or itching.  She reports she has vaginal atrophy but this is not new. Denies any changes.  Allergy to Bactrim.    Patient  denies any fever, body aches,chills, rash, chest pain, shortness of breath, nausea, vomiting, or diarrhea.        Medications: Outpatient Medications Prior to Visit  Medication Sig  . albuterol (PROVENTIL HFA;VENTOLIN HFA) 108 (90 Base) MCG/ACT inhaler Inhale 2 puffs every 6 (six) hours as needed into the lungs.  . citalopram (CELEXA) 20 MG tablet TAKE 1 AND 1/2 TABLETS BY  MOUTH DAILY  . clonazePAM (KLONOPIN) 1 MG tablet TAKE 1 TABLET BY MOUTH AT  BEDTIME  . diclofenac Sodium (VOLTAREN) 1 % GEL Apply 4 g topically 4 (four) times daily.  . fluticasone (FLONASE) 50 MCG/ACT nasal spray Place 2 sprays into both nostrils daily.  Marland Kitchen lidocaine (XYLOCAINE) 2 % solution Use as directed 15 mLs in the mouth or throat as needed for mouth pain.  Marland Kitchen acyclovir (ZOVIRAX) 400 MG tablet Take 400 mg by mouth 3 (three) times daily. (Patient not taking:  Reported on 03/11/2020)  . acyclovir ointment (ZOVIRAX) 5 % Apply 1 application topically every 3 (three) hours. (Patient not taking: Reported on 03/11/2020)   No facility-administered medications prior to visit.    Review of Systems  Constitutional: Negative.  Negative for chills.  HENT: Negative.   Respiratory: Negative.   Cardiovascular: Negative.   Gastrointestinal: Negative.  Negative for nausea and vomiting.  Genitourinary: Positive for dysuria, frequency and hesitancy. Negative for decreased urine volume, difficulty urinating, dyspareunia, enuresis, flank pain, genital sores, hematuria, menstrual problem, pelvic pain, urgency, vaginal bleeding, vaginal discharge and vaginal pain.  Hematological: Negative.   Psychiatric/Behavioral: Negative.        Objective    BP 107/63   Pulse 74   Temp 98.4 F (36.9 C) (Oral)   Resp 16   Wt 110 lb 3.2 oz (50 kg)   SpO2 96%   BMI 17.79 kg/m  Wt Readings from Last 3 Encounters:  03/11/20 110 lb 3.2 oz (50 kg)  10/07/19 109 lb (49.4 kg)  06/15/19 114 lb (51.7 kg)      Physical Exam Vitals reviewed.  Constitutional:      General: She is not in acute distress.    Appearance: Normal appearance. She is not ill-appearing, toxic-appearing or diaphoretic.  HENT:     Head: Normocephalic and atraumatic.     Right Ear: External ear normal.     Left Ear: External ear normal.     Mouth/Throat:  Mouth: Mucous membranes are moist.  Eyes:     Conjunctiva/sclera: Conjunctivae normal.  Cardiovascular:     Rate and Rhythm: Normal rate and regular rhythm.     Pulses: Normal pulses.     Heart sounds: Normal heart sounds. No murmur heard.  No friction rub. No gallop.   Pulmonary:     Effort: Pulmonary effort is normal. No respiratory distress.     Breath sounds: Normal breath sounds. No stridor. No wheezing, rhonchi or rales.  Chest:     Chest wall: No tenderness.  Abdominal:     General: There is no distension.     Palpations:  Abdomen is soft.     Tenderness: There is no abdominal tenderness. There is no right CVA tenderness, left CVA tenderness or guarding.  Skin:    General: Skin is warm.     Findings: No erythema.  Neurological:     General: No focal deficit present.     Mental Status: She is oriented to person, place, and time.     Gait: Gait normal.  Psychiatric:        Mood and Affect: Mood normal.        Behavior: Behavior normal.        Thought Content: Thought content normal.        Judgment: Judgment normal.       Results for orders placed or performed in visit on 03/11/20  POCT urinalysis dipstick  Result Value Ref Range   Color, UA yellow    Clarity, UA clear    Glucose, UA Negative Negative   Bilirubin, UA negative    Ketones, UA moderate    Spec Grav, UA 1.025 1.010 - 1.025   Blood, UA negative    pH, UA 5.0 5.0 - 8.0   Protein, UA Negative Negative   Urobilinogen, UA 0.2 0.2 or 1.0 E.U./dL   Nitrite, UA negative    Leukocytes, UA Trace (A) Negative   Appearance     Odor      Assessment & Plan     Dysuria - Plan: POCT urinalysis dipstick, Urine Culture  Leukocytes in urine  Meds ordered this encounter  Medications  . cephALEXin (KEFLEX) 500 MG capsule    Sig: Take 1 capsule (500 mg total) by mouth 2 (two) times daily.    Dispense:  7 capsule    Refill:  0   Increase hydration .strict seek care and return precautions advised at anytime if worsening.     Return in about 1 week (around 03/18/2020), or if symptoms worsen or fail to improve, for at any time for any worsening symptoms, Go to Emergency room/ urgent care if worse.       Marcille Buffy, [place] [phone] (phone) [phone] (fax)  [place]

## 2020-03-11 NOTE — Telephone Encounter (Signed)
LMTCB 03/11/2020.   PEC please advise pt and schedule an appointment for her.   Thanks,   -Mickel Baas

## 2020-03-22 LAB — URINE CULTURE

## 2020-03-22 NOTE — Progress Notes (Signed)
E coli in urine, Keflex should have covered. Return if any symptoms worsen or persist at anytime.

## 2020-03-24 ENCOUNTER — Encounter: Payer: Self-pay | Admitting: Physician Assistant

## 2020-03-24 ENCOUNTER — Other Ambulatory Visit: Payer: Self-pay

## 2020-03-24 ENCOUNTER — Ambulatory Visit (INDEPENDENT_AMBULATORY_CARE_PROVIDER_SITE_OTHER): Payer: Medicare Other

## 2020-03-24 DIAGNOSIS — Z23 Encounter for immunization: Secondary | ICD-10-CM | POA: Diagnosis not present

## 2020-03-26 ENCOUNTER — Other Ambulatory Visit: Payer: Self-pay | Admitting: Family Medicine

## 2020-03-26 DIAGNOSIS — F419 Anxiety disorder, unspecified: Secondary | ICD-10-CM

## 2020-03-26 NOTE — Telephone Encounter (Signed)
Requested medication (s) are due for refill today: yes  Requested medication (s) are on the active medication list: yes  Last refill:  09/09/19  Future visit scheduled: no  Notes to clinic:  med not delegated to NT to RF   Requested Prescriptions  Pending Prescriptions Disp Refills   clonazePAM (KLONOPIN) 1 MG tablet [Pharmacy Med Name: CLONAZEPAM  1MG   TAB] 90 tablet     Sig: TAKE 1 TABLET BY MOUTH AT  BEDTIME      Not Delegated - Psychiatry:  Anxiolytics/Hypnotics Failed - 03/26/2020 10:32 AM      Failed - This refill cannot be delegated      Failed - Urine Drug Screen completed in last 360 days      Passed - Valid encounter within last 6 months    Recent Outpatient Visits           2 weeks ago [place], Kelby Aline, FNP   1 month ago Sore throat   [facility] Carles Collet M, PA-C   5 months ago Right medial tibial stress syndrome, initial encounter   [facility] [place], Dionne Bucy, MD   9 months ago Encounter for annual wellness visit (AWV) in Medicare patient   [facility] [place], Dionne Bucy, MD   1 year ago Arthralgia of right temporomandibular joint   [facility] [place], Dionne Bucy, MD

## 2020-03-31 ENCOUNTER — Ambulatory Visit: Payer: Medicare Other

## 2020-06-15 ENCOUNTER — Encounter: Payer: 59 | Admitting: Family Medicine

## 2020-07-04 ENCOUNTER — Other Ambulatory Visit: Payer: Self-pay

## 2020-07-04 ENCOUNTER — Ambulatory Visit (INDEPENDENT_AMBULATORY_CARE_PROVIDER_SITE_OTHER): Payer: Medicare Other | Admitting: Family Medicine

## 2020-07-04 ENCOUNTER — Encounter: Payer: Self-pay | Admitting: Family Medicine

## 2020-07-04 VITALS — BP 122/70 | HR 70 | Temp 98.1°F | Resp 16 | Ht 66.0 in | Wt 112.7 lb

## 2020-07-04 DIAGNOSIS — M8589 Other specified disorders of bone density and structure, multiple sites: Secondary | ICD-10-CM

## 2020-07-04 DIAGNOSIS — R7303 Prediabetes: Secondary | ICD-10-CM | POA: Insufficient documentation

## 2020-07-04 DIAGNOSIS — Z0000 Encounter for general adult medical examination without abnormal findings: Secondary | ICD-10-CM | POA: Diagnosis not present

## 2020-07-04 DIAGNOSIS — Z1211 Encounter for screening for malignant neoplasm of colon: Secondary | ICD-10-CM

## 2020-07-04 DIAGNOSIS — R636 Underweight: Secondary | ICD-10-CM

## 2020-07-04 DIAGNOSIS — F419 Anxiety disorder, unspecified: Secondary | ICD-10-CM

## 2020-07-04 DIAGNOSIS — E559 Vitamin D deficiency, unspecified: Secondary | ICD-10-CM | POA: Diagnosis not present

## 2020-07-04 NOTE — Progress Notes (Unsigned)
Annual Wellness Visit     Patient: Catherine Arnold, Female    DOB: 05/26/1950, 70 y.o.   MRN: [id number] Visit Date: 07/04/2020  Today's Provider: Lavon Paganini, MD   Chief Complaint  Patient presents with  . Follow-up   Subjective    Catherine Arnold is a 70 y.o. female who presents today for her Annual Wellness Visit. She reports consuming a general diet. Home exercise routine includes walking 5 hrs per weeks. She generally feels well. She reports sleeping well. She does not have additional problems to discuss today.   HPI Anxiety is well controlled.  Patient Active Problem List   Diagnosis Date Noted  . Prediabetes 07/04/2020  . Leukocytes in urine 03/11/2020  . Dysuria 03/11/2020  . Trochanteric bursitis of right hip 06/15/2019  . Underweight 01/22/2019  . Arthralgia of right temporomandibular joint 01/22/2019  . Allergic rhinitis 02/01/2015  . H/O malignant neoplasm of female breast 02/01/2015  . 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 H/O alcohol abuse 02/01/2015  . Osteopenia 02/01/2015  . 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 Avitaminosis D 02/01/2015  . Anxiety 10/22/2014   Past Surgical History:  Procedure Laterality Date  . APPENDECTOMY    . BUNIONECTOMY  2006  . ELBOW SURGERY Bilateral   . KNEE SURGERY Bilateral 2014   meniscus tear  . MASTECTOMY Bilateral 10/19/2008  . TONSILLECTOMY     Social History   Socioeconomic History  . Marital status: Single    Spouse name: Not on file  . Number of children: Not on file  . Years of education: Not on file  . Highest education level: Not on file  Occupational History  . Not on file  Tobacco Use  . Smoking status: Former Smoker    Quit date: 01/20/1992    Years since quitting: 28.4  . Smokeless tobacco: Never Used  Vaping Use  . Vaping Use: Never used  Substance and Sexual Activity  . Alcohol use: Yes    Alcohol/week: 7.0 standard drinks    Types: 7 Cans of beer per week  . Drug use: No  . Sexual activity:  Not on file  Other Topics Concern  . Not on file  Social History Narrative  . Not on file   Social Determinants of Health   Financial Resource Strain: Not on file  Food Insecurity: Not on file  Transportation Needs: Not on file  Physical Activity: Not on file  Stress: Not on file  Social Connections: Not on file  Intimate Partner Violence: Not on file   Family History  Problem Relation Age of Onset  . Dementia Mother   . Seizures Sister    Allergies  Allergen Reactions  . Bee Venom Anaphylaxis  . Paclitaxel Shortness Of Breath and Anaphylaxis    Other reaction(s): ANAPHYLAXIS Other reaction(s): SHORTNESS OF BREATH  . Prochlorperazine Maleate Other (See Comments)    Other reaction(s): Unknown reaction  . Oxycodone-Acetaminophen     Other reaction(s): NAUSEA  . Sulfa Antibiotics        Medications: Outpatient Medications Prior to Visit  Medication Sig  . acyclovir (ZOVIRAX) 400 MG tablet Take 400 mg by mouth as needed.  . citalopram (CELEXA) 20 MG tablet TAKE 1 AND 1/2 TABLETS BY  MOUTH DAILY  . clonazePAM (KLONOPIN) 1 MG tablet TAKE 1 TABLET BY MOUTH AT  BEDTIME (Patient taking differently: Take 0.5 mg by mouth at bedtime.)  . fluticasone (FLONASE) 50 MCG/ACT nasal spray Place 2 sprays into both  nostrils daily. (Patient taking differently: Place 2 sprays into both nostrils as needed.)  . lidocaine (XYLOCAINE) 2 % solution Use as directed 15 mLs in the mouth or throat as needed for mouth pain.  . [DISCONTINUED] acyclovir ointment (ZOVIRAX) 5 % Apply 1 application topically every 3 (three) hours. (Patient not taking: Reported on 03/11/2020)  . [DISCONTINUED] albuterol (PROVENTIL HFA;VENTOLIN HFA) 108 (90 Base) MCG/ACT inhaler Inhale 2 puffs every 6 (six) hours as needed into the lungs.  . [DISCONTINUED] cephALEXin (KEFLEX) 500 MG capsule Take 1 capsule (500 mg total) by mouth 2 (two) times daily.  . [DISCONTINUED] diclofenac Sodium (VOLTAREN) 1 % GEL Apply 4 g topically 4  (four) times daily.   No facility-administered medications prior to visit.    Allergies  Allergen Reactions  . Bee Venom Anaphylaxis  . Paclitaxel Shortness Of Breath and Anaphylaxis    Other reaction(s): ANAPHYLAXIS Other reaction(s): SHORTNESS OF BREATH  . Prochlorperazine Maleate Other (See Comments)    Other reaction(s): Unknown reaction  . Oxycodone-Acetaminophen     Other reaction(s): NAUSEA  . Sulfa Antibiotics     Patient Care Team: Virginia Crews, MD as PCP - General (Family Medicine)  Review of Systems  Constitutional: Negative.   HENT: Negative.   Eyes: Negative.   Respiratory: Negative.   Cardiovascular: Negative.   Gastrointestinal: Negative.   Endocrine: Negative.   Genitourinary: Negative.   Musculoskeletal: Positive for neck pain.  Skin: Negative.   Allergic/Immunologic: Negative.   Neurological: Negative.   Hematological: Negative.   Psychiatric/Behavioral: Negative.     Last CBC Lab Results  Component Value Date   WBC 4.1 06/15/2019   HGB 13.2 06/15/2019   HCT 38.3 06/15/2019   MCV 89 06/15/2019   MCH 30.8 06/15/2019   RDW 12.6 06/15/2019   PLT 296 57/84/6962   Last metabolic panel Lab Results  Component Value Date   GLUCOSE 91 06/15/2019   NA 135 06/15/2019   K 4.0 06/15/2019   CL 96 06/15/2019   CO2 22 06/15/2019   BUN 8 06/15/2019   CREATININE 0.84 06/15/2019   GFRNONAA 72 06/15/2019   GFRAA 83 06/15/2019   CALCIUM 9.4 06/15/2019   PROT 7.1 06/15/2019   ALBUMIN 4.7 06/15/2019   LABGLOB 2.4 06/15/2019   AGRATIO 2.0 06/15/2019   BILITOT 0.4 06/15/2019   ALKPHOS 47 06/15/2019   AST 25 06/15/2019   ALT 12 06/15/2019   ANIONGAP 12 04/08/2017   Last lipids Lab Results  Component Value Date   CHOL 195 06/15/2019   HDL 97 06/15/2019   LDLCALC 88 06/15/2019   TRIG 53 06/15/2019   CHOLHDL 2.0 06/15/2019   Last hemoglobin A1c Lab Results  Component Value Date   HGBA1C 5.8 (H) 06/15/2019   Last vitamin D Lab Results   Component Value Date   VD25OH 23.5 (L) 06/15/2019      Objective    Vitals: BP 122/70 (BP Location: Left Arm, Patient Position: Sitting, Cuff Size: Normal)   Pulse 70   Temp 98.1 F (36.7 C) (Oral)   Resp 16   Ht 5\' 6"  (1.676 m)   Wt 112 lb 11.2 oz (51.1 kg)   SpO2 99%   BMI 18.19 kg/m  BP Readings from Last 3 Encounters:  07/04/20 122/70  03/11/20 107/63  10/07/19 128/79   Wt Readings from Last 3 Encounters:  07/04/20 112 lb 11.2 oz (51.1 kg)  03/11/20 110 lb 3.2 oz (50 kg)  10/07/19 109 lb (49.4 kg)  Physical Exam Vitals reviewed.  Constitutional:      General: She is not in acute distress.    Appearance: Normal appearance. She is well-developed. She is not diaphoretic.  HENT:     Head: Normocephalic and atraumatic.     Right Ear: Tympanic membrane, ear canal and external ear normal.     Left Ear: Tympanic membrane, ear canal and external ear normal.  Eyes:     General: No scleral icterus.    Conjunctiva/sclera: Conjunctivae normal.     Pupils: Pupils are equal, round, and reactive to light.  Neck:     Thyroid: No thyromegaly.  Cardiovascular:     Rate and Rhythm: Normal rate and regular rhythm.     Pulses: Normal pulses.     Heart sounds: Normal heart sounds. No murmur heard.   Pulmonary:     Effort: Pulmonary effort is normal. No respiratory distress.     Breath sounds: Normal breath sounds. No wheezing or rales.  Abdominal:     General: There is no distension.     Palpations: Abdomen is soft.     Tenderness: There is no abdominal tenderness.  Musculoskeletal:        General: No deformity.     Cervical back: Neck supple.     Right lower leg: No edema.     Left lower leg: No edema.  Lymphadenopathy:     Cervical: No cervical adenopathy.  Skin:    General: Skin is warm and dry.     Findings: No rash.  Neurological:     Mental Status: She is alert and oriented to person, place, and time. Mental status is at baseline.     Sensory: No sensory  deficit.     Motor: No weakness.     Gait: Gait normal.  Psychiatric:        Mood and Affect: Mood normal.        Behavior: Behavior normal.        Thought Content: Thought content normal.      Most recent functional status assessment: In your present state of health, do you have any difficulty performing the following activities: 07/04/2020  Hearing? N  Vision? N  Difficulty concentrating or making decisions? N  Walking or climbing stairs? N  Dressing or bathing? N  Doing errands, shopping? N  Some recent data might be hidden   Most recent fall risk assessment: Fall Risk  07/04/2020  Falls in the past year? 0  Number falls in past yr: 0  Injury with Fall? -  Risk for fall due to : No Fall Risks  Follow up Falls evaluation completed    Most recent depression screenings: PHQ 2/9 Scores 07/04/2020 06/15/2019  PHQ - 2 Score 0 0  PHQ- 9 Score 0 0   Most recent cognitive screening: No flowsheet data found. Most recent Audit-C alcohol use screening Alcohol Use Disorder Test (AUDIT) 07/04/2020  1. How often do you have a drink containing alcohol? 3  2. How many drinks containing alcohol do you have on a typical day when you are drinking? 0  3. How often do you have six or more drinks on one occasion? 0  AUDIT-C Score 3  4. How often during the last year have you found that you were not able to stop drinking once you had started? 0  5. How often during the last year have you failed to do what was normally expected from you because of drinking? 0  6. How  often during the last year have you needed a first drink in the morning to get yourself going after a heavy drinking session? 0  7. How often during the last year have you had a feeling of guilt of remorse after drinking? 0  8. How often during the last year have you been unable to remember what happened the night before because you had been drinking? 0  9. Have you or someone else been injured as a result of your drinking? 0  10.  Has a relative or friend or a doctor or another health worker been concerned about your drinking or suggested you cut down? 0  Alcohol Use Disorder Identification Test Final Score (AUDIT) 3  Alcohol Brief Interventions/Follow-up AUDIT Score <7 follow-up not indicated   A score of 3 or more in women, and 4 or more in men indicates increased risk for alcohol abuse, EXCEPT if all of the points are from question 1   No results found for any visits on 07/04/20.  Assessment & Plan     Annual wellness visit done today including the all of the following: Reviewed patient's Family Medical History Reviewed and updated list of patient's medical providers Assessment of cognitive impairment was done Assessed patient's functional ability Established a written schedule for health screening [place] Completed and Reviewed  Exercise Activities and Dietary recommendations Goals   None     Immunization History  Administered Date(s) Administered  . Fluad Quad(high Dose 65+) 01/22/2019, 02/11/2020  . Influenza, High Dose Seasonal PF 03/07/2017, 02/22/2018  . Influenza,inj,Quad PF,6+ Mos 03/26/2015  . Influenza-Unspecified 02/09/2016  . PFIZER(Purple Top)SARS-COV-2 Vaccination 06/13/2019, 07/04/2019, 03/24/2020  . Pneumococcal Conjugate-13 03/07/2017  . Pneumococcal Polysaccharide-23 06/11/2018  . Td 06/11/2018  . Zoster 02/27/2012  . Zoster Recombinat (Shingrix) 06/14/2017, 08/21/2017    Health Maintenance  Topic Date Due  . Fecal DNA (Cologuard)  04/05/2020  . TETANUS/TDAP  06/11/2028  . INFLUENZA VACCINE  Completed  . DEXA SCAN  Completed  . COVID-19 Vaccine  Completed  . Hepatitis C Screening  Completed  . PNA vac Low Risk Adult  Completed     Discussed health benefits of physical activity, and encouraged her to engage in regular exercise appropriate for her age and condition.    Problem List Items Addressed This Visit      Musculoskeletal and Integument    Osteopenia    Repeat DEXA scan Continue calcium and vitamin D supplementation Given history of chemotherapy and endocrine therapy for breast cancer, will consider endocrine referral for treatment options if this is progressed      Relevant Orders   VITAMIN D 25 Hydroxy (Vit-D Deficiency, Fractures) (Completed)   DG Bone Density     Other   Anxiety    Chronic and well-controlled Continue Celexa at current dose Continue low-dose Klonopin as needed      Avitaminosis D    Continue supplement Recheck level      Relevant Orders   VITAMIN D 25 Hydroxy (Vit-D Deficiency, Fractures) (Completed)   Underweight    Discussed importance of ideal weight, but her weight has stabilized Continue to monitor      Relevant Orders   Comprehensive metabolic panel (Completed)   Prediabetes    Recommend low carb diet Recheck A1c      Relevant Orders   Comprehensive metabolic panel (Completed)   Hemoglobin A1c (Completed)    Other Visit Diagnoses    Encounter for annual wellness visit (AWV) in Medicare patient    -  Primary   Screen for colon cancer       Relevant Orders   Cologuard       Return in about 1 year (around 07/04/2021) for CPE.     I, Lavon Paganini, MD, have reviewed all documentation for this visit. The documentation on 07/05/20 for the exam, diagnosis, procedures, and orders are all accurate and complete.   [place], Dionne Bucy, MD, MPH [facility]

## 2020-07-04 NOTE — Patient Instructions (Signed)
Preventive Care 69 Years and Older, Female Preventive care refers to lifestyle choices and visits with your health care provider that can promote health and wellness. This includes:  A yearly physical exam. This is also called an annual wellness visit.  Regular dental and eye exams.  Immunizations.  Screening for certain conditions.  Healthy lifestyle choices, such as: ? Eating a healthy diet. ? Getting regular exercise. ? Not using drugs or products that contain nicotine and tobacco. ? Limiting alcohol use. What can I expect for my preventive care visit? Physical exam Your health care provider will check your:  Height and weight. These may be used to calculate your BMI (body mass index). BMI is a measurement that tells if you are at a healthy weight.  Heart rate and blood pressure.  Body temperature.  Skin for abnormal spots. Counseling Your health care provider may ask you questions about your:  Past medical problems.  Family's medical history.  Alcohol, tobacco, and drug use.  Emotional well-being.  Home life and relationship well-being.  Sexual activity.  Diet, exercise, and sleep habits.  History of falls.  Memory and ability to understand (cognition).  Work and work Statistician.  Pregnancy and menstrual history.  Access to firearms. What immunizations do I need? Vaccines are usually given at various ages, according to a schedule. Your health care provider will recommend vaccines for you based on your age, medical history, and lifestyle or other factors, such as travel or where you work.   What tests do I need? Blood tests  Lipid and cholesterol levels. These may be checked every 5 years, or more often depending on your overall health.  Hepatitis C test.  Hepatitis B test. Screening  Lung cancer screening. You may have this screening every year starting at age 69 if you have a 30-pack-year history of smoking and currently smoke or have quit within  the past 15 years.  Colorectal cancer screening. ? All adults should have this screening starting at age 69 and continuing until age 69. ? Your health care provider may recommend screening at age 69 if you are at increased risk. ? You will have tests every 1-10 years, depending on your results and the type of screening test.  Diabetes screening. ? This is done by checking your blood sugar (glucose) after you have not eaten for a while (fasting). ? You may have this done every 1-3 years.  Mammogram. ? This may be done every 1-2 years. ? Talk with your health care provider about how often you should have regular mammograms.  Abdominal aortic aneurysm (AAA) screening. You may need this if you are a current or former smoker.  BRCA-related cancer screening. This may be done if you have a family history of breast, ovarian, tubal, or peritoneal cancers. Other tests  STD (sexually transmitted disease) testing, if you are at risk.  Bone density scan. This is done to screen for osteoporosis. You may have this done starting at age 69. Talk with your health care provider about your test results, treatment options, and if necessary, the need for more tests. Follow these instructions at home: Eating and drinking  Eat a diet that includes fresh fruits and vegetables, whole grains, lean protein, and low-fat dairy products. Limit your intake of foods with high amounts of sugar, saturated fats, and salt.  Take vitamin and mineral supplements as recommended by your health care provider.  Do not drink alcohol if your health care provider tells you not to drink.  If you drink alcohol: ? Limit how much you have to 0-1 drink a day. ? Be aware of how much alcohol is in your drink. In the [place] one drink equals one 12 oz bottle of beer (355 mL), one 5 oz glass of wine (148 mL), or one 1 oz glass of hard liquor (44 mL).   Lifestyle  Take daily care of your teeth and gums. Brush your teeth every morning  and night with fluoride toothpaste. Floss one time each day.  Stay active. Exercise for at least 30 minutes 5 or more days each week.  Do not use any products that contain nicotine or tobacco, such as cigarettes, e-cigarettes, and chewing tobacco. If you need help quitting, ask your health care provider.  Do not use drugs.  If you are sexually active, practice safe sex. Use a condom or other form of protection in order to prevent STIs (sexually transmitted infections).  Talk with your health care provider about taking a low-dose aspirin or statin.  Find healthy ways to cope with stress, such as: ? Meditation, yoga, or listening to music. ? Journaling. ? Talking to a trusted person. ? Spending time with friends and family. Safety  Always wear your seat belt while driving or riding in a vehicle.  Do not drive: ? If you have been drinking alcohol. Do not ride with someone who has been drinking. ? When you are tired or distracted. ? While texting.  Wear a helmet and other protective equipment during sports activities.  If you have firearms in your house, make sure you follow all gun safety procedures. What's next?  Visit your health care provider once a year for an annual wellness visit.  Ask your health care provider how often you should have your eyes and teeth checked.  Stay up to date on all vaccines. This information is not intended to replace advice given to you by your health care provider. Make sure you discuss any questions you have with your health care provider. Document Revised: 04/27/2020 Document Reviewed: 05/01/2018 Elsevier Patient Education  2021 Elsevier Inc.  

## 2020-07-05 ENCOUNTER — Encounter: Payer: Self-pay | Admitting: Family Medicine

## 2020-07-05 LAB — COMPREHENSIVE METABOLIC PANEL
ALT: 31 IU/L (ref 0–32)
AST: 36 IU/L (ref 0–40)
Albumin/Globulin Ratio: 2 (ref 1.2–2.2)
Albumin: 4.9 g/dL — ABNORMAL HIGH (ref 3.8–4.8)
Alkaline Phosphatase: 51 IU/L (ref 44–121)
BUN/Creatinine Ratio: 10 — ABNORMAL LOW (ref 12–28)
BUN: 8 mg/dL (ref 8–27)
Bilirubin Total: 0.5 mg/dL (ref 0.0–1.2)
CO2: 25 mmol/L (ref 20–29)
Calcium: 9.5 mg/dL (ref 8.7–10.3)
Chloride: 96 mmol/L (ref 96–106)
Creatinine, Ser: 0.82 mg/dL (ref 0.57–1.00)
GFR calc Af Amer: 85 mL/min/{1.73_m2} (ref >59)
GFR calc non Af Amer: 74 mL/min/{1.73_m2} (ref >59)
Globulin, Total: 2.4 g/dL (ref 1.5–4.5)
Glucose: 94 mg/dL (ref 65–99)
Potassium: 4.7 mmol/L (ref 3.5–5.2)
Sodium: 135 mmol/L (ref 134–144)
Total Protein: 7.3 g/dL (ref 6.0–8.5)

## 2020-07-05 LAB — VITAMIN D 25 HYDROXY (VIT D DEFICIENCY, FRACTURES): Vit D, 25-Hydroxy: 40.5 ng/mL (ref 30.0–100.0)

## 2020-07-05 LAB — HEMOGLOBIN A1C
Est. average glucose Bld gHb Est-mCnc: 111 mg/dL
Hgb A1c MFr Bld: 5.5 % (ref 4.8–5.6)

## 2020-07-05 NOTE — Assessment & Plan Note (Signed)
Chronic and well-controlled Continue Celexa at current dose Continue low-dose Klonopin as needed

## 2020-07-05 NOTE — Assessment & Plan Note (Signed)
Recommend low carb diet °Recheck A1c  °

## 2020-07-05 NOTE — Assessment & Plan Note (Signed)
Discussed importance of ideal weight, but her weight has stabilized Continue to monitor

## 2020-07-05 NOTE — Assessment & Plan Note (Signed)
Repeat DEXA scan Continue calcium and vitamin D supplementation Given history of chemotherapy and endocrine therapy for breast cancer, will consider endocrine referral for treatment options if this is progressed

## 2020-07-05 NOTE — Assessment & Plan Note (Signed)
Continue supplement Recheck level 

## 2020-07-12 LAB — COLOGUARD: Cologuard: NEGATIVE

## 2020-07-17 LAB — COLOGUARD: COLOGUARD: NEGATIVE

## 2020-07-22 ENCOUNTER — Encounter: Payer: Self-pay | Admitting: Family Medicine

## 2020-08-16 ENCOUNTER — Encounter: Payer: Self-pay | Admitting: Family Medicine

## 2020-08-20 ENCOUNTER — Encounter: Payer: Self-pay | Admitting: Family Medicine

## 2020-08-22 ENCOUNTER — Other Ambulatory Visit: Payer: Self-pay

## 2020-08-22 DIAGNOSIS — F339 Major depressive disorder, recurrent, unspecified: Secondary | ICD-10-CM

## 2020-08-22 DIAGNOSIS — F419 Anxiety disorder, unspecified: Secondary | ICD-10-CM

## 2020-08-22 MED ORDER — CLONAZEPAM 1 MG PO TABS
0.5000 mg | ORAL_TABLET | Freq: Every day | ORAL | 1 refills | Status: DC
Start: 2020-08-22 — End: 2021-01-12

## 2020-08-22 MED ORDER — CITALOPRAM HYDROBROMIDE 20 MG PO TABS: 30.0000 mg | ORAL_TABLET | Freq: Every day | ORAL | 1 refills | Status: DC

## 2020-08-22 NOTE — Telephone Encounter (Signed)
Doc...need refills on Citalopram and Clonipin meds.  Unable to refill through [facility] as have new provider, [place], [facility].  Number is [phone], fax is [phone].  Almost out of Citalopram.  Can you have [facility] refill [facility], [facility], or [facility]. Let me know.  Called for appt for shot...said would call back...did not...will call this week again.  Thanks in advance for help with refills.  PS May retire soon...turn 70 next month.Marland Kitchen  Hope your arms continue to improve!

## 2020-08-23 ENCOUNTER — Encounter: Payer: Self-pay | Admitting: Family Medicine

## 2020-08-24 ENCOUNTER — Ambulatory Visit (INDEPENDENT_AMBULATORY_CARE_PROVIDER_SITE_OTHER): Payer: Medicare Other

## 2020-08-24 ENCOUNTER — Other Ambulatory Visit: Payer: Self-pay

## 2020-08-24 DIAGNOSIS — Z23 Encounter for immunization: Secondary | ICD-10-CM | POA: Diagnosis not present

## 2020-09-16 ENCOUNTER — Encounter: Payer: Self-pay | Admitting: Family Medicine

## 2020-09-16 ENCOUNTER — Other Ambulatory Visit: Payer: Self-pay | Admitting: Family Medicine

## 2020-09-16 MED ORDER — GABAPENTIN 300 MG PO CAPS: 300.0000 mg | ORAL_CAPSULE | Freq: Every day | ORAL | 1 refills | Status: DC

## 2020-09-16 NOTE — Telephone Encounter (Signed)
Pt called advised of message and to wait for nurse call back. She is requesting to have cortisone shot if possible. Please advise.

## 2020-09-16 NOTE — Telephone Encounter (Signed)
Start Gabapentin 300mg  qhs. Ok to send Rx #30 r1. If not improving, needs to be evaluated. Warn about drowsiness.

## 2020-09-16 NOTE — Progress Notes (Signed)
g

## 2020-09-28 ENCOUNTER — Encounter: Payer: Self-pay | Admitting: Family Medicine

## 2020-09-28 ENCOUNTER — Other Ambulatory Visit: Payer: Self-pay | Admitting: Family Medicine

## 2020-09-28 DIAGNOSIS — J302 Other seasonal allergic rhinitis: Secondary | ICD-10-CM

## 2020-09-28 MED ORDER — FLUTICASONE PROPIONATE 50 MCG/ACT NA SUSP: 2.0000 | Freq: Every day | NASAL | 3 refills | Status: DC

## 2020-11-30 ENCOUNTER — Encounter: Payer: Self-pay | Admitting: Family Medicine

## 2021-01-10 ENCOUNTER — Encounter: Payer: Self-pay | Admitting: Family Medicine

## 2021-01-10 DIAGNOSIS — F419 Anxiety disorder, unspecified: Secondary | ICD-10-CM

## 2021-01-12 MED ORDER — CLONAZEPAM 1 MG PO TABS: 0.5000 mg | ORAL_TABLET | Freq: Every day | ORAL | 1 refills | Status: DC

## 2021-01-12 NOTE — Telephone Encounter (Signed)
Please send a Rx request

## 2021-01-13 ENCOUNTER — Other Ambulatory Visit: Payer: Self-pay

## 2021-01-13 DIAGNOSIS — F419 Anxiety disorder, unspecified: Secondary | ICD-10-CM

## 2021-01-13 MED ORDER — CLONAZEPAM 1 MG PO TABS
0.5000 mg | ORAL_TABLET | Freq: Every day | ORAL | 1 refills | Status: DC
Start: 2021-01-13 — End: 2021-10-17

## 2021-01-25 ENCOUNTER — Other Ambulatory Visit: Payer: Self-pay

## 2021-01-25 ENCOUNTER — Encounter: Payer: Self-pay | Admitting: Adult Health

## 2021-01-25 ENCOUNTER — Ambulatory Visit (INDEPENDENT_AMBULATORY_CARE_PROVIDER_SITE_OTHER): Payer: Medicare Other

## 2021-01-25 DIAGNOSIS — Z23 Encounter for immunization: Secondary | ICD-10-CM

## 2021-02-10 ENCOUNTER — Encounter: Payer: Self-pay | Admitting: Family Medicine

## 2021-02-10 ENCOUNTER — Encounter: Payer: Self-pay | Admitting: Adult Health

## 2021-02-12 ENCOUNTER — Encounter: Payer: Self-pay | Admitting: Family Medicine

## 2021-02-12 DIAGNOSIS — J302 Other seasonal allergic rhinitis: Secondary | ICD-10-CM

## 2021-02-12 DIAGNOSIS — F339 Major depressive disorder, recurrent, unspecified: Secondary | ICD-10-CM

## 2021-02-13 ENCOUNTER — Telehealth: Payer: Self-pay

## 2021-02-13 DIAGNOSIS — F339 Major depressive disorder, recurrent, unspecified: Secondary | ICD-10-CM

## 2021-02-13 DIAGNOSIS — J302 Other seasonal allergic rhinitis: Secondary | ICD-10-CM

## 2021-02-13 MED ORDER — FLUTICASONE PROPIONATE 50 MCG/ACT NA SUSP: 2.0000 | Freq: Every day | NASAL | 3 refills | Status: AC

## 2021-02-13 MED ORDER — CITALOPRAM HYDROBROMIDE 20 MG PO TABS: 30.0000 mg | ORAL_TABLET | Freq: Every day | ORAL | 1 refills | Status: DC

## 2021-02-13 MED ORDER — FLUTICASONE PROPIONATE 50 MCG/ACT NA SUSP: 2.0000 | Freq: Every day | NASAL | 3 refills | Status: DC

## 2021-02-13 NOTE — Telephone Encounter (Signed)
Ok to send refill. Thanks!

## 2021-02-13 NOTE — Telephone Encounter (Signed)
Sommer, Spickard P  P [facility] (supporting Virginia Crews, MD) 18 hours ago (1:02 PM)   Need a refill of Citalopram and Fluticasone Nasal spray called into [facility] when you get a chance. Getting low on both.   Thanks doc.

## 2021-06-17 ENCOUNTER — Encounter: Payer: Self-pay | Admitting: Family Medicine

## 2021-06-18 ENCOUNTER — Encounter: Payer: Self-pay | Admitting: Family Medicine

## 2021-06-19 MED ORDER — ACYCLOVIR 400 MG PO TABS: 400.0000 mg | ORAL_TABLET | Freq: Two times a day (BID) | ORAL | 3 refills | Status: DC

## 2021-06-19 NOTE — Telephone Encounter (Signed)
See other reply

## 2021-07-06 NOTE — Progress Notes (Signed)
Annual Wellness Visit     Patient: Catherine Arnold, Female    DOB: [birth year], 71 y.o.   MRN: [id number] Visit Date: 07/07/2021  Today's Provider: Lavon Paganini, MD   Chief Complaint  Patient presents with   Annual Exam   Subjective    Catherine Arnold is a 71 y.o. female who presents today for her Annual Wellness Visit. She reports consuming a general diet.  She generally feels well. She reports sleeping well. She does not have additional problems to discuss today.   HPI  Seeing Ortho re pinched nerve in her neck. Doing PT   Medications: Outpatient Medications Prior to Visit  Medication Sig   acyclovir (ZOVIRAX) 400 MG tablet Take 1 tablet (400 mg total) by mouth 2 (two) times daily. Suppressive therapy   citalopram (CELEXA) 20 MG tablet Take 1.5 tablets (30 mg total) by mouth daily.   clonazePAM (KLONOPIN) 1 MG tablet Take 0.5 tablets (0.5 mg total) by mouth at bedtime.   fluticasone (FLONASE) 50 MCG/ACT nasal spray Place 2 sprays into both nostrils daily.   [DISCONTINUED] gabapentin (NEURONTIN) 300 MG capsule Take 1 capsule (300 mg total) by mouth at bedtime.   [DISCONTINUED] lidocaine (XYLOCAINE) 2 % solution Use as directed 15 mLs in the mouth or throat as needed for mouth pain.   No facility-administered medications prior to visit.    Allergies  Allergen Reactions   Bee Venom Anaphylaxis   Paclitaxel Shortness Of Breath and Anaphylaxis    Other reaction(s): ANAPHYLAXIS Other reaction(s): SHORTNESS OF BREATH   Prochlorperazine Maleate Other (See Comments)    Other reaction(s): Unknown reaction   Oxycodone-Acetaminophen     Other reaction(s): NAUSEA   Sulfa Antibiotics     Patient Care Team: Virginia Crews, MD as PCP - General (Family Medicine)  Review of Systems  Constitutional: Negative.   HENT:  Positive for congestion, postnasal drip, rhinorrhea and sneezing.   Eyes: Negative.   Respiratory:  Positive for chest tightness.   Cardiovascular:  Negative.   Gastrointestinal: Negative.   Endocrine: Negative.   Genitourinary: Negative.   Musculoskeletal:  Positive for neck pain.  Skin: Negative.   Allergic/Immunologic: Positive for environmental allergies.  Neurological: Negative.   Hematological: Negative.   Psychiatric/Behavioral: Negative.          Objective    Vitals: BP 117/68 (BP Location: Left Arm, Patient Position: Sitting, Cuff Size: Normal)    Pulse 75    Temp 97.8 F (36.6 C) (Oral)    Wt 114 lb (51.7 kg)    SpO2 100%    BMI 18.40 kg/m     Physical Exam Constitutional:      Appearance: Normal appearance. She is normal weight.  HENT:     Head: Normocephalic and atraumatic.     Right Ear: Tympanic membrane, ear canal and external ear normal.     Left Ear: Tympanic membrane, ear canal and external ear normal.     Nose: Nose normal.     Mouth/Throat:     Mouth: Mucous membranes are moist.     Pharynx: Oropharynx is clear.  Eyes:     Extraocular Movements: Extraocular movements intact.     Conjunctiva/sclera: Conjunctivae normal.     Pupils: Pupils are equal, round, and reactive to light.  Cardiovascular:     Rate and Rhythm: Normal rate and regular rhythm.     Pulses: Normal pulses.     Heart sounds: Normal heart sounds.  Pulmonary:  Effort: Pulmonary effort is normal.     Breath sounds: Normal breath sounds.  Abdominal:     General: Abdomen is flat. Bowel sounds are normal.     Palpations: Abdomen is soft.  Musculoskeletal:        General: Normal range of motion.     Cervical back: Normal range of motion and neck supple.  Skin:    General: Skin is warm and dry.  Neurological:     General: No focal deficit present.     Mental Status: She is alert and oriented to person, place, and time. Mental status is at baseline.  Psychiatric:        Mood and Affect: Mood normal.        Behavior: Behavior normal.        Thought Content: Thought content normal.        Judgment: Judgment normal.     Most  recent functional status assessment: In your present state of health, do you have any difficulty performing the following activities: 07/06/2021  Hearing? N  Vision? N  Difficulty concentrating or making decisions? N  Walking or climbing stairs? N  Dressing or bathing? N  Doing errands, shopping? N  Preparing Food and eating ? N  Using the Toilet? N  In the past six months, have you accidently leaked urine? N  Do you have problems with loss of bowel control? N  Managing your Medications? N  Managing your Finances? N  Housekeeping or managing your Housekeeping? N  Some recent data might be hidden   Most recent fall risk assessment: Fall Risk  07/06/2021  Falls in the past year? 0  Number falls in past yr: -  Injury with Fall? 0  Risk for fall due to : -  Follow up -    Most recent depression screenings: PHQ 2/9 Scores 07/04/2020 06/15/2019  PHQ - 2 Score 0 0  PHQ- 9 Score 0 0   Most recent cognitive screening: No flowsheet data found. Most recent Audit-C alcohol use screening Alcohol Use Disorder Test (AUDIT) 07/06/2021  1. How often do you have a drink containing alcohol? 4  2. How many drinks containing alcohol do you have on a typical day when you are drinking? 0  3. How often do you have six or more drinks on one occasion? 0  AUDIT-C Score 4  4. How often during the last year have you found that you were not able to stop drinking once you had started? -  5. How often during the last year have you failed to do what was normally expected from you because of drinking? -  6. How often during the last year have you needed a first drink in the morning to get yourself going after a heavy drinking session? -  7. How often during the last year have you had a feeling of guilt of remorse after drinking? -  8. How often during the last year have you been unable to remember what happened the night before because you had been drinking? -  9. Have you or someone else been injured as a  result of your drinking? -  10. Has a relative or friend or a doctor or another health worker been concerned about your drinking or suggested you cut down? -  Alcohol Use Disorder Identification Test Final Score (AUDIT) -  Alcohol Brief Interventions/Follow-up -   A score of 3 or more in women, and 4 or more in men indicates increased risk  for alcohol abuse, EXCEPT if all of the points are from question 1   No results found for any visits on 07/07/21.  Assessment & Plan     Annual wellness visit done today including the all of the following: Reviewed patient's Family Medical History Reviewed and updated list of patient's medical providers Assessment of cognitive impairment was done Assessed patient's functional ability Established a written schedule for health screening [place] Completed and Reviewed  Exercise Activities and Dietary recommendations  Goals   None     Immunization History  Administered Date(s) Administered   Fluad Quad(high Dose 65+) 01/22/2019, 02/11/2020, 01/25/2021   Influenza, High Dose Seasonal PF 03/07/2017, 02/22/2018   Influenza,inj,Quad PF,6+ Mos 03/26/2015   Influenza-Unspecified 02/09/2016   PFIZER Comirnaty(Gray Top)Covid-19 Tri-Sucrose Vaccine 08/24/2020   PFIZER(Purple Top)SARS-COV-2 Vaccination 06/13/2019, 07/04/2019, 03/24/2020   Pneumococcal Conjugate-13 03/07/2017   Pneumococcal Polysaccharide-23 06/11/2018   Td 06/11/2018   Zoster Recombinat (Shingrix) 06/14/2017, 08/21/2017   Zoster, Live 02/27/2012    Health Maintenance  Topic Date Due   COVID-19 Vaccine (5 - Booster for Pfizer series) 10/19/2020   Fecal DNA (Cologuard)  07/13/2023   TETANUS/TDAP  06/11/2028   Pneumonia Vaccine 82+ Years old  Completed   INFLUENZA VACCINE  Completed   DEXA SCAN  Completed   Hepatitis C Screening  Completed   Zoster Vaccines- Shingrix  Completed   HPV VACCINES  Aged Out     Discussed health benefits of physical activity,  and encouraged her to engage in regular exercise appropriate for her age and condition.    Problem List Items Addressed This Visit       Musculoskeletal and Integument   Osteopenia    Repeat DEXA Continue Vit D      Relevant Orders   VITAMIN D 25 Hydroxy (Vit-D Deficiency, Fractures)   DG Bone Density     Other   Anxiety    Chronic and well controlled Continue celexa at current dose Continue low dose klonopin for sleep      Avitaminosis D    Continue supplement Recheck level      Relevant Orders   VITAMIN D 25 Hydroxy (Vit-D Deficiency, Fractures)   Underweight    Stable Discussed importance of healthy weight management Discussed diet and exercise       Prediabetes    Recommend low carb diet Recheck A1c      Relevant Orders   Comprehensive metabolic panel   Lipid panel   Hemoglobin A1c   Other Visit Diagnoses     Encounter for annual wellness visit (AWV) in Medicare patient    -  Primary   Postmenopausal       Relevant Orders   DG Bone Density   Mild protein-calorie malnutrition ([place])       Relevant Orders   Lipid panel        Return in about 1 year (around 07/07/2022) for AWV.     Argentina Ponder [place],acting as a scribe for Lavon Paganini, MD.,have documented all relevant documentation on the behalf of Lavon Paganini, MD,as directed by  Lavon Paganini, MD while in the presence of Lavon Paganini, MD.  I, Lavon Paganini, MD, have reviewed all documentation for this visit. The documentation on 07/07/21 for the exam, diagnosis, procedures, and orders are all accurate and complete.   Amiracle Neises, Dionne Bucy, MD, MPH [facility]

## 2021-07-07 ENCOUNTER — Encounter: Payer: Self-pay | Admitting: Family Medicine

## 2021-07-07 ENCOUNTER — Other Ambulatory Visit: Payer: Self-pay

## 2021-07-07 ENCOUNTER — Ambulatory Visit (INDEPENDENT_AMBULATORY_CARE_PROVIDER_SITE_OTHER): Payer: Medicare Other | Admitting: Family Medicine

## 2021-07-07 VITALS — BP 117/68 | HR 75 | Temp 97.8°F | Wt 114.0 lb

## 2021-07-07 DIAGNOSIS — F419 Anxiety disorder, unspecified: Secondary | ICD-10-CM

## 2021-07-07 DIAGNOSIS — Z780 Asymptomatic menopausal state: Secondary | ICD-10-CM

## 2021-07-07 DIAGNOSIS — E441 Mild protein-calorie malnutrition: Secondary | ICD-10-CM

## 2021-07-07 DIAGNOSIS — Z0000 Encounter for general adult medical examination without abnormal findings: Secondary | ICD-10-CM | POA: Diagnosis not present

## 2021-07-07 DIAGNOSIS — E559 Vitamin D deficiency, unspecified: Secondary | ICD-10-CM | POA: Diagnosis not present

## 2021-07-07 DIAGNOSIS — R636 Underweight: Secondary | ICD-10-CM

## 2021-07-07 DIAGNOSIS — R7303 Prediabetes: Secondary | ICD-10-CM

## 2021-07-07 DIAGNOSIS — M8589 Other specified disorders of bone density and structure, multiple sites: Secondary | ICD-10-CM

## 2021-07-07 NOTE — Assessment & Plan Note (Signed)
Continue supplement Recheck level 

## 2021-07-07 NOTE — Assessment & Plan Note (Signed)
Repeat DEXA Continue Vit D

## 2021-07-07 NOTE — Assessment & Plan Note (Signed)
Chronic and well controlled Continue celexa at current dose Continue low dose klonopin for sleep 

## 2021-07-07 NOTE — Assessment & Plan Note (Signed)
Recommend low carb diet °Recheck A1c  °

## 2021-07-07 NOTE — Assessment & Plan Note (Signed)
Stable Discussed importance of healthy weight management Discussed diet and exercise

## 2021-07-13 LAB — COMPREHENSIVE METABOLIC PANEL
ALT: 15 IU/L (ref 0–32)
AST: 19 IU/L (ref 0–40)
Albumin/Globulin Ratio: 2.1 (ref 1.2–2.2)
Albumin: 4.7 g/dL (ref 3.8–4.8)
Alkaline Phosphatase: 48 IU/L (ref 44–121)
BUN/Creatinine Ratio: 13 (ref 12–28)
BUN: 13 mg/dL (ref 8–27)
Bilirubin Total: 0.2 mg/dL (ref 0.0–1.2)
CO2: 24 mmol/L (ref 20–29)
Calcium: 9.6 mg/dL (ref 8.7–10.3)
Chloride: 96 mmol/L (ref 96–106)
Creatinine, Ser: 0.97 mg/dL (ref 0.57–1.00)
Globulin, Total: 2.2 g/dL (ref 1.5–4.5)
Glucose: 94 mg/dL (ref 70–99)
Potassium: 4.7 mmol/L (ref 3.5–5.2)
Sodium: 134 mmol/L (ref 134–144)
Total Protein: 6.9 g/dL (ref 6.0–8.5)
eGFR: 63 mL/min/{1.73_m2} (ref >59)

## 2021-07-13 LAB — LIPID PANEL
Chol/HDL Ratio: 2.2 ratio (ref 0.0–4.4)
Cholesterol, Total: 184 mg/dL (ref 100–199)
HDL: 85 mg/dL (ref >39)
LDL Chol Calc (NIH): 85 mg/dL (ref 0–99)
Triglycerides: 76 mg/dL (ref 0–149)
VLDL Cholesterol Cal: 14 mg/dL (ref 5–40)

## 2021-07-13 LAB — HEMOGLOBIN A1C
Est. average glucose Bld gHb Est-mCnc: 111 mg/dL
Hgb A1c MFr Bld: 5.5 % (ref 4.8–5.6)

## 2021-07-13 LAB — VITAMIN D 25 HYDROXY (VIT D DEFICIENCY, FRACTURES): Vit D, 25-Hydroxy: 35.2 ng/mL (ref 30.0–100.0)

## 2021-08-15 ENCOUNTER — Ambulatory Visit (INDEPENDENT_AMBULATORY_CARE_PROVIDER_SITE_OTHER): Payer: Medicare Other | Admitting: Podiatry

## 2021-08-15 ENCOUNTER — Other Ambulatory Visit: Payer: Self-pay

## 2021-08-15 DIAGNOSIS — L600 Ingrowing nail: Secondary | ICD-10-CM | POA: Diagnosis not present

## 2021-08-16 NOTE — Progress Notes (Signed)
?  Subjective:  ?Patient ID: Catherine Arnold, female    DOB: 08/29/1950,  MRN: [id number] ? ?Chief Complaint  ?Patient presents with  ? Ingrown Toenail  ? ? ?71 y.o. female presents with the above complaint.  Patient presents with complaint of right hallux lateral border ingrown.  Patient stated painful to touch.  She is a hurts with ambulation.  She plays a lot of golf.  She would like for me to remove it if possible.  She let the nail grow out which could also be the culprit.  She has a previous history of ingrown nail removal done long time ago which seems to have been made permanent.  She denies any other acute complaints. ? ? ?Review of Systems: Negative except as noted in the HPI. Denies N/V/F/Ch. ? ?Past Medical History:  ?Diagnosis Date  ? Cancer [facility])   ? ? ?Current Outpatient Medications:  ?  acyclovir (ZOVIRAX) 400 MG tablet, Take 1 tablet (400 mg total) by mouth 2 (two) times daily. Suppressive therapy, Disp: 180 tablet, Rfl: 3 ?  citalopram (CELEXA) 20 MG tablet, Take 1.5 tablets (30 mg total) by mouth daily., Disp: 135 tablet, Rfl: 1 ?  clonazePAM (KLONOPIN) 1 MG tablet, Take 0.5 tablets (0.5 mg total) by mouth at bedtime., Disp: 90 tablet, Rfl: 1 ?  fluticasone (FLONASE) 50 MCG/ACT nasal spray, Place 2 sprays into both nostrils daily., Disp: 48 g, Rfl: 3 ? ?Social History  ? ?Tobacco Use  ?Smoking Status Former  ? Types: Cigarettes  ? Quit date: 01/20/1992  ? Years since quitting: 29.5  ?Smokeless Tobacco Never  ? ? ?Allergies  ?Allergen Reactions  ? Bee Venom Anaphylaxis  ? Paclitaxel Shortness Of Breath and Anaphylaxis  ?  Other reaction(s): ANAPHYLAXIS ?Other reaction(s): SHORTNESS OF BREATH  ? Prochlorperazine Maleate Other (See Comments)  ?  Other reaction(s): Unknown reaction  ? Oxycodone-Acetaminophen   ?  Other reaction(s): NAUSEA  ? Sulfa Antibiotics   ? ?Objective:  ?There were no vitals filed for this visit. ?There is no height or weight on file to calculate BMI. ?Constitutional Well  developed. ?Well nourished.  ?Vascular Dorsalis pedis pulses palpable bilaterally. ?Posterior tibial pulses palpable bilaterally. ?Capillary refill normal to all digits.  ?No cyanosis or clubbing noted. ?Pedal hair growth normal.  ?Neurologic Normal speech. ?Oriented to person, place, and time. ?Epicritic sensation to light touch grossly present bilaterally.  ?Dermatologic Painful ingrowing nail at lateral nail borders of the hallux nail right. ?No other open wounds. ?No skin lesions.  ?Orthopedic: Normal joint ROM without pain or crepitus bilaterally. ?No visible deformities. ?No bony tenderness.  ? ?Radiographs: None ?Assessment:  ? ?1. Ingrown toenail of right foot   ? ?Plan:  ?Patient was evaluated and treated and all questions answered. ? ?Ingrown Nail, right ?-I discussed with the patient and given her history of previous ingrown nail removal matricectomy I do not see any signs of recurrent ingrown.  However because of her length of the nail it could be causing her to gag against the skin and the nerve causing her some pain.  I discussed and shown her the appropriate nail length.  I performed a slant back procedure on the nail which seems to give her immediate relief.  I encouraged her to cut her nails short so therefore does not touch the nerve.  Patient agrees with the plan. ? ?No follow-ups on file. ?

## 2021-08-19 ENCOUNTER — Encounter: Payer: Self-pay | Admitting: Family Medicine

## 2021-08-19 DIAGNOSIS — F339 Major depressive disorder, recurrent, unspecified: Secondary | ICD-10-CM

## 2021-08-21 MED ORDER — CITALOPRAM HYDROBROMIDE 20 MG PO TABS: 30.0000 mg | ORAL_TABLET | Freq: Every day | ORAL | 1 refills | Status: DC

## 2021-08-29 ENCOUNTER — Other Ambulatory Visit: Payer: Medicare Other

## 2021-08-31 ENCOUNTER — Other Ambulatory Visit: Payer: Medicare Other

## 2021-09-12 ENCOUNTER — Ambulatory Visit: Payer: Medicare Other | Admitting: Podiatry

## 2021-10-13 ENCOUNTER — Encounter: Payer: Self-pay | Admitting: Family Medicine

## 2021-10-13 DIAGNOSIS — F419 Anxiety disorder, unspecified: Secondary | ICD-10-CM

## 2021-10-17 MED ORDER — CLONAZEPAM 1 MG PO TABS: 0.5000 mg | ORAL_TABLET | Freq: Every day | ORAL | 1 refills | Status: DC

## 2021-10-25 ENCOUNTER — Ambulatory Visit
Admission: RE | Admit: 2021-10-25 | Discharge: 2021-10-25 | Disposition: A | Discharge status: Home / Self Care | Payer: Medicare Other | Source: Ambulatory Visit | Attending: Family Medicine | Admitting: Family Medicine

## 2021-10-25 DIAGNOSIS — M8589 Other specified disorders of bone density and structure, multiple sites: Secondary | ICD-10-CM | POA: Diagnosis present

## 2021-10-25 DIAGNOSIS — Z780 Asymptomatic menopausal state: Secondary | ICD-10-CM | POA: Diagnosis present

## 2021-10-26 NOTE — Progress Notes (Signed)
Catherine Arnold,acting as a Education administrator for Catherine Paganini, MD.,have documented all relevant documentation on the behalf of Catherine Paganini, MD,as directed by  Catherine Paganini, MD while in the presence of Catherine Paganini, MD.   Established patient visit   Patient: Catherine Arnold   DOB: 02-Jan-1951   71 y.o. Female  MRN: [id number] Visit Date: 10/27/2021  Today's healthcare provider: Lavon Paganini, MD   Chief Complaint  Patient presents with   Follow-up   Subjective    HPI  Follow up for bone density result  DB bone density scan done 10/25/2021. Changes made at last visit include consider fosamax treatment.  Patient reports being very active working out 3 times a week.   never taken fosamax DEXA with T score -2.7 in L spine   -----------------------------------------------------------------------------------------   Medications: Outpatient Medications Prior to Visit  Medication Sig   acyclovir (ZOVIRAX) 400 MG tablet Take 1 tablet (400 mg total) by mouth 2 (two) times daily. Suppressive therapy   citalopram (CELEXA) 20 MG tablet Take 1.5 tablets (30 mg total) by mouth daily.   clonazePAM (KLONOPIN) 1 MG tablet Take 0.5 tablets (0.5 mg total) by mouth at bedtime.   fluticasone (FLONASE) 50 MCG/ACT nasal spray Place 2 sprays into both nostrils daily.   No facility-administered medications prior to visit.    Review of Systems  Constitutional:  Negative for appetite change and fatigue.  Respiratory:  Negative for chest tightness and shortness of breath.   Cardiovascular:  Negative for chest pain, palpitations and leg swelling.        Objective    BP 121/75 (BP Location: Left Arm, Patient Position: Sitting, Cuff Size: Normal)   Pulse 75   Temp 98.3 F (36.8 C) (Oral)   Resp 16   Ht '5\' 5"'$  (1.651 m)   Wt 113 lb (51.3 kg)   BMI 18.80 kg/m  BP Readings from Last 3 Encounters:  10/27/21 121/75  07/07/21 117/68  07/04/20 122/70   Wt Readings from Last  3 Encounters:  10/27/21 113 lb (51.3 kg)  07/07/21 114 lb (51.7 kg)  07/04/20 112 lb 11.2 oz (51.1 kg)      Physical Exam Vitals reviewed.  Constitutional:      General: She is not in acute distress.    Appearance: She is well-developed.  HENT:     Head: Normocephalic and atraumatic.  Eyes:     General: No scleral icterus.    Conjunctiva/sclera: Conjunctivae normal.  Cardiovascular:     Rate and Rhythm: Normal rate and regular rhythm.  Pulmonary:     Effort: Pulmonary effort is normal. No respiratory distress.  Skin:    General: Skin is warm and dry.     Findings: No rash.  Neurological:     Mental Status: She is alert and oriented to person, place, and time.  Psychiatric:        Behavior: Behavior normal.       No results found for any visits on 10/27/21.  Assessment & Plan     Problem List Items Addressed This Visit       Musculoskeletal and Integument   Osteoporosis - Primary    Repeat DEXA shows progression to osteoporosis of L spine Continue to remain physically active Add Ca supplement and continue Vit D Start fosamax - discussed how to take it and possible side effects Repeat DEXA in 2 wks      Relevant Medications   alendronate (FOSAMAX) 70 MG tablet  Return in about 8 months (around 06/29/2022) for CPE, as scheduled.      I, Catherine Paganini, MD, have reviewed all documentation for this visit. The documentation on 10/27/21 for the exam, diagnosis, procedures, and orders are all accurate and complete.   Catherine Arnold, Catherine Bucy, MD, MPH [facility]

## 2021-10-27 ENCOUNTER — Ambulatory Visit (INDEPENDENT_AMBULATORY_CARE_PROVIDER_SITE_OTHER): Payer: Medicare Other | Admitting: Family Medicine

## 2021-10-27 ENCOUNTER — Encounter: Payer: Self-pay | Admitting: Family Medicine

## 2021-10-27 VITALS — BP 121/75 | HR 75 | Temp 98.3°F | Resp 16 | Ht 65.0 in | Wt 113.0 lb

## 2021-10-27 DIAGNOSIS — M810 Age-related osteoporosis without current pathological fracture: Secondary | ICD-10-CM

## 2021-10-27 MED ORDER — ALENDRONATE SODIUM 70 MG PO TABS: 70.0000 mg | ORAL_TABLET | ORAL | 5 refills | Status: DC

## 2021-10-27 NOTE — Patient Instructions (Signed)
Calcium 1200mg daily, Vit D 2000units daily

## 2021-10-27 NOTE — Assessment & Plan Note (Signed)
Repeat DEXA shows progression to osteoporosis of L spine Continue to remain physically active Add Ca supplement and continue Vit D Start fosamax - discussed how to take it and possible side effects Repeat DEXA in 2 wks

## 2021-10-31 NOTE — Telephone Encounter (Signed)
Please place Endo referral for osteoporosis. Thanks!

## 2021-11-05 ENCOUNTER — Encounter: Payer: Self-pay | Admitting: Family Medicine

## 2022-01-29 ENCOUNTER — Ambulatory Visit: Payer: Self-pay

## 2022-01-29 ENCOUNTER — Ambulatory Visit (INDEPENDENT_AMBULATORY_CARE_PROVIDER_SITE_OTHER): Payer: Medicare Other | Admitting: Family Medicine

## 2022-01-29 ENCOUNTER — Encounter: Payer: Self-pay | Admitting: Family Medicine

## 2022-01-29 VITALS — BP 129/82 | HR 73 | Temp 97.7°F | Resp 16 | Wt 110.7 lb

## 2022-01-29 DIAGNOSIS — R300 Dysuria: Secondary | ICD-10-CM | POA: Diagnosis not present

## 2022-01-29 LAB — POCT URINALYSIS DIPSTICK
Bilirubin, UA: NEGATIVE
Glucose, UA: NEGATIVE
Ketones, UA: NEGATIVE
Nitrite, UA: POSITIVE
Protein, UA: NEGATIVE
Spec Grav, UA: <=1.005 — AB (ref 1.010–1.025)
Urobilinogen, UA: 0.2 E.U./dL
pH, UA: 7.5 (ref 5.0–8.0)

## 2022-01-29 MED ORDER — CEPHALEXIN 500 MG PO CAPS: 500.0000 mg | ORAL_CAPSULE | Freq: Four times a day (QID) | ORAL | 0 refills | Status: AC

## 2022-01-29 NOTE — Progress Notes (Signed)
    SUBJECTIVE:   CHIEF COMPLAINT / HPI:   URINARY SYMPTOMS  Symptom onset: Saturday night COVID negative. Dysuria: no Urinary frequency: yes Urgency: yes Urinary incontinence: no Hematuria: no Abdominal pain: yes Back pain: no Suprapubic pain/pressure: yes Flank pain: no Fever:  yes Vomiting: no Status: better/worse/stable Previous urinary tract infection: yes Recurrent urinary tract infection: no Vaginal discharge: no Treatments attempted: increasing fluids , ibuprofen  OBJECTIVE:   BP 129/82 (BP Location: Left Arm, Patient Position: Sitting, Cuff Size: Normal)   Pulse 73   Temp 97.7 F (36.5 C) (Oral)   Resp 16   Wt 110 lb 11.2 oz (50.2 kg)   BMI 18.42 kg/m   Gen: well appearing, in NAD Card: Reg rate Lungs: Comfortable WOB on RA Ext: WWP, no edema  ASSESSMENT/PLAN:   Urinary frequency UA consistent with UTI with large leuks and positive nitrites. Will send for culture and provide keflex, adjust as indicated. F/u prn.    Myles Gip, DO

## 2022-01-29 NOTE — Telephone Encounter (Signed)
  Chief Complaint: frequency, fatigue Symptoms: fever to 102, fatigue. Lightheadedness (now resolved), bladder pressure (pelvic pressure) Frequency: onset Sunday Pertinent Negatives: Patient denies back pain, blood in urine, flank pain, pain with urination Disposition: '[]'$ ED /'[]'$ Urgent Care (no appt availability in office) / '[x]'$ Appointment(In office/virtual)/ '[]'$  [facility]/ '[]'$ Home Care/ '[]'$ Refused Recommended Disposition /'[]'$ [facility]/ '[]'$  Follow-up with PCP Additional Notes: pt given same day appt for this am at 1140. Reason for Disposition  Fever > 100.4 F (38.0 C)  Answer Assessment - Initial Assessment Questions 1. SYMPTOM: "What's the main symptom you're concerned about?" (e.g., frequency, incontinence)     Frequency, pelvic pressure 2. ONSET: "When did the sx   start?"     Sunday 3. PAIN: "Is there any pain?" If Yes, ask: "How bad is it?" (Scale: 1-10; mild, moderate, severe)     no 4. CAUSE: "What do you think is causing the symptoms?"     UTI 5. OTHER SYMPTOMS: "Do you have any other symptoms?" (e.g., blood in urine, fever, flank pain, pain with urination)     Bladder pressure, fatigue  6. PREGNANCY: "Is there any chance you are pregnant?" "When was your last menstrual period?"     N/a  Protocols used: Urinary Symptoms-A-AH

## 2022-01-30 ENCOUNTER — Encounter: Payer: Self-pay | Admitting: Family Medicine

## 2022-01-30 LAB — URINALYSIS, MICROSCOPIC ONLY
Casts: NONE SEEN /lpf
Epithelial Cells (non renal): NONE SEEN /hpf (ref 0–10)
WBC, UA: >30 /hpf — AB (ref 0–5)

## 2022-01-31 LAB — URINE CULTURE

## 2022-02-06 DIAGNOSIS — S63639A Sprain of interphalangeal joint of unspecified finger, initial encounter: Secondary | ICD-10-CM | POA: Insufficient documentation

## 2022-02-06 DIAGNOSIS — M79644 Pain in right finger(s): Secondary | ICD-10-CM | POA: Insufficient documentation

## 2022-02-08 ENCOUNTER — Encounter: Payer: Self-pay | Admitting: Family Medicine

## 2022-02-08 MED ORDER — CEPHALEXIN 500 MG PO CAPS: 500.0000 mg | ORAL_CAPSULE | Freq: Three times a day (TID) | ORAL | 0 refills | Status: AC

## 2022-02-26 ENCOUNTER — Encounter: Payer: Self-pay | Admitting: Family Medicine

## 2022-02-28 ENCOUNTER — Ambulatory Visit: Payer: Medicare Other | Admitting: Physician Assistant

## 2022-03-06 ENCOUNTER — Ambulatory Visit: Payer: Medicare Other | Admitting: Family Medicine

## 2022-04-03 ENCOUNTER — Ambulatory Visit: Payer: Medicare Other | Admitting: Family Medicine

## 2022-04-10 ENCOUNTER — Other Ambulatory Visit: Payer: Self-pay | Admitting: Family Medicine

## 2022-04-10 DIAGNOSIS — F419 Anxiety disorder, unspecified: Secondary | ICD-10-CM

## 2022-04-11 NOTE — Telephone Encounter (Signed)
Requested medication (s) are due for refill today: yes  Requested medication (s) are on the active medication list: yes  Last refill:  10/17/21  Future visit scheduled: yes  Notes to clinic:  Unable to refill per protocol, cannot delegate.      Requested Prescriptions  Pending Prescriptions Disp Refills   clonazePAM (KLONOPIN) 1 MG tablet [Pharmacy Med Name: clonazePAM 1 MG TABLET] 45 tablet     Sig: TAKE 1/2 TABLET BY MOUTH AT BEDTIME     Not Delegated - Psychiatry: Anxiolytics/Hypnotics 2 Failed - 04/10/2022  9:23 PM      Failed - This refill cannot be delegated      Failed - Urine Drug Screen completed in last 360 days      Passed - Patient is not pregnant      Passed - Valid encounter within last 6 months    Recent Outpatient Visits           2 months ago [place], [place], DO   5 months ago Age-related osteoporosis without current pathological fracture   [facility] [place], Dionne Bucy, MD   9 months ago Encounter for annual wellness visit (AWV) in Medicare patient   [facility] [place], Dionne Bucy, MD   1 year ago Encounter for annual wellness visit (AWV) in Medicare patient   [facility] [place], Dionne Bucy, MD   2 years ago [place], Kelby Aline, FNP

## 2022-04-17 ENCOUNTER — Encounter: Payer: Self-pay | Admitting: Family Medicine

## 2022-04-17 ENCOUNTER — Ambulatory Visit (INDEPENDENT_AMBULATORY_CARE_PROVIDER_SITE_OTHER): Payer: Medicare Other | Admitting: Family Medicine

## 2022-04-17 VITALS — BP 130/77 | HR 90 | Resp 16 | Wt 113.1 lb

## 2022-04-17 DIAGNOSIS — R21 Rash and other nonspecific skin eruption: Secondary | ICD-10-CM | POA: Diagnosis not present

## 2022-04-17 MED ORDER — PREDNISONE 20 MG PO TABS: 40.0000 mg | ORAL_TABLET | Freq: Every day | ORAL | 0 refills | Status: AC

## 2022-04-17 NOTE — Progress Notes (Signed)
    SUBJECTIVE:   CHIEF COMPLAINT / HPI:   RASH - red rash to lower legs starting 11//7, initially thought due to chigger bites. Tried calamine lotion, sudafed, helped a little. Now having itching with subsequent rash to chest, legs when scratching. Itching makes hard to sleep. - worse when sitting still, able to distract herself from itching while working. - h/o of similar rash/itching in 20s with welts, unclear etiology at that time. Benadryl helped at that time.   Duration:  about a month  Location: generalized  Itching: yes Burning: yes Redness: yes Oozing: no Scaling: no Blisters: no Painful: yes Fevers: no Change in detergents/soaps/personal care products: no Recent illness: no History of same: yes Context: worse Alleviating factors: nothing Treatments attempted: calamine lotion, sudafed, benadryl Shortness of breath: no  Throat/tongue swelling: no   OBJECTIVE:   BP 130/77 (BP Location: Left Arm, Patient Position: Sitting, Cuff Size: Normal)   Pulse 90   Resp 16   Wt 113 lb 1.6 oz (51.3 kg)   SpO2 100%   BMI 18.82 kg/m   Gen: well appearing, in NAD Skin: dry throughout, occasional diffuse erythematous excoriations to lower legs, chest. No current hives/welts present.   ASSESSMENT/PLAN:   Rash Unclear etiology, appears allergic in nature with unclear trigger given appearance and symptoms. Does not appear to have systemic involvement. Will trial steroid burst given severity of itching with daily antihistamine after finishing. Recommend hypoallergenic creams/detergents, avoiding fragrances.  F/u prn.    Myles Gip, DO

## 2022-04-18 ENCOUNTER — Encounter: Payer: Self-pay | Admitting: Family Medicine

## 2022-04-19 ENCOUNTER — Encounter: Payer: Self-pay | Admitting: Family Medicine

## 2022-04-24 ENCOUNTER — Ambulatory Visit: Payer: Medicare Other | Admitting: Family Medicine

## 2022-04-24 ENCOUNTER — Encounter: Payer: Self-pay | Admitting: Family Medicine

## 2022-04-25 ENCOUNTER — Other Ambulatory Visit: Payer: Self-pay | Admitting: Family Medicine

## 2022-04-25 ENCOUNTER — Ambulatory Visit (INDEPENDENT_AMBULATORY_CARE_PROVIDER_SITE_OTHER): Payer: Medicare Other | Admitting: Family Medicine

## 2022-04-25 ENCOUNTER — Encounter: Payer: Self-pay | Admitting: Family Medicine

## 2022-04-25 VITALS — BP 125/73 | HR 84 | Wt 114.2 lb

## 2022-04-25 DIAGNOSIS — R21 Rash and other nonspecific skin eruption: Secondary | ICD-10-CM | POA: Diagnosis not present

## 2022-04-25 DIAGNOSIS — F419 Anxiety disorder, unspecified: Secondary | ICD-10-CM

## 2022-04-25 MED ORDER — HYDROXYZINE PAMOATE 25 MG PO CAPS: 25.0000 mg | ORAL_CAPSULE | Freq: Three times a day (TID) | ORAL | 0 refills | Status: DC | PRN

## 2022-04-25 NOTE — Assessment & Plan Note (Signed)
Unclear etiology, appears allergic in nature with unclear trigger given appearance, symptoms and improvement with prednisone. Does not appear to have systemic involvement. Will trial hydroxyzine, continue daily antihistamine. Recommend hypoallergenic creams/detergents, avoiding fragrances. Refer to allergy for evaluation.

## 2022-04-25 NOTE — Progress Notes (Signed)
   SUBJECTIVE:   CHIEF COMPLAINT / HPI:   RASH - seen previously 11/28 for same, red rash to lower legs starting 11/7 refractory to OTC treatment. Gave prednisone. Similar h/o rash/itching when in 20s with welts, unclear etiology at that time, self-resolved.  - took 4 of 5 days of prednisone with improvement. Back Tuesday. Took fosamax Monday but has been on for about 3-4 months. - has since discontinued all body oils, shampoos, detergents Duration:  months  Location: generalized  Itching: yes Burning: yes Redness: yes Oozing: no Scaling: no Blisters: no Painful:  with itching Fevers: no Change in detergents/soaps/personal care products: not prior to rash Recent illness: no Recent travel:no History of same: years ago Context: fluctuating Alleviating factors:  prednisone and hydrocortisone cream Treatments attempted:hydrocortisone cream and lotion/moisturizer, benadryl, calamine lotion, sudafed, claritin Shortness of breath: no  Throat/tongue swelling: no Myalgias/arthralgias: no   OBJECTIVE:   BP 125/73 (BP Location: Left Arm, Patient Position: Sitting, Cuff Size: Normal)   Pulse 84   Wt 114 lb 3.2 oz (51.8 kg)   SpO2 99%   BMI 19.00 kg/m   Gen: well appearing, in NAD Card: Reg rate Lungs: Comfortable WOB on RA Ext: WWP, no edema. Skin: erythematous excoriations to chest. Legs improved from previous visit. No current hives/welts present.   ASSESSMENT/PLAN:   Rash Unclear etiology, appears allergic in nature with unclear trigger given appearance, symptoms and improvement with prednisone. Does not appear to have systemic involvement. Will trial hydroxyzine, continue daily antihistamine. Recommend hypoallergenic creams/detergents, avoiding fragrances. Refer to allergy for evaluation.     Myles Gip, DO

## 2022-04-26 LAB — CBC
Hematocrit: 39.2 % (ref 34.0–46.6)
Hemoglobin: 13.2 g/dL (ref 11.1–15.9)
MCH: 32 pg (ref 26.6–33.0)
MCHC: 33.7 g/dL (ref 31.5–35.7)
MCV: 95 fL (ref 79–97)
Platelets: 298 10*3/uL (ref 150–450)
RBC: 4.13 x10E6/uL (ref 3.77–5.28)
RDW: 12 % (ref 11.7–15.4)
WBC: 6.5 10*3/uL (ref 3.4–10.8)

## 2022-04-26 LAB — COMPREHENSIVE METABOLIC PANEL
ALT: 12 IU/L (ref 0–32)
AST: 17 IU/L (ref 0–40)
Albumin/Globulin Ratio: 2 (ref 1.2–2.2)
Albumin: 4.4 g/dL (ref 3.9–4.9)
Alkaline Phosphatase: 41 IU/L — ABNORMAL LOW (ref 44–121)
BUN/Creatinine Ratio: 18 (ref 12–28)
BUN: 14 mg/dL (ref 8–27)
Bilirubin Total: 0.3 mg/dL (ref 0.0–1.2)
CO2: 22 mmol/L (ref 20–29)
Calcium: 9.1 mg/dL (ref 8.7–10.3)
Chloride: 96 mmol/L (ref 96–106)
Creatinine, Ser: 0.78 mg/dL (ref 0.57–1.00)
Globulin, Total: 2.2 g/dL (ref 1.5–4.5)
Glucose: 160 mg/dL — ABNORMAL HIGH (ref 70–99)
Potassium: 3.7 mmol/L (ref 3.5–5.2)
Sodium: 132 mmol/L — ABNORMAL LOW (ref 134–144)
Total Protein: 6.6 g/dL (ref 6.0–8.5)
eGFR: 82 mL/min/{1.73_m2} (ref >59)

## 2022-04-26 MED ORDER — CLONAZEPAM 1 MG PO TABS: 0.5000 mg | ORAL_TABLET | Freq: Every day | ORAL | 1 refills | Status: DC

## 2022-04-27 ENCOUNTER — Encounter: Payer: Self-pay | Admitting: Family Medicine

## 2022-05-08 ENCOUNTER — Encounter: Payer: Self-pay | Admitting: Family Medicine

## 2022-05-08 ENCOUNTER — Other Ambulatory Visit: Payer: Self-pay | Admitting: Family Medicine

## 2022-05-08 DIAGNOSIS — F339 Major depressive disorder, recurrent, unspecified: Secondary | ICD-10-CM

## 2022-05-09 NOTE — Telephone Encounter (Signed)
Requested Prescriptions  Pending Prescriptions Disp Refills   citalopram (CELEXA) 20 MG tablet [Pharmacy Med Name: CITALOPRAM HBR 20 MG TABLET] 135 tablet 0    Sig: TAKE 1 AND 1/2 TABLET BY MOUTH DAILY     Psychiatry:  Antidepressants - SSRI Passed - 05/08/2022  5:23 PM      Passed - Valid encounter within last 6 months    Recent Outpatient Visits           2 weeks ago [place], [place], DO   3 weeks ago Rash and nonspecific skin eruption   [facility] Myles Gip, DO   3 months ago [place], [place], DO   6 months ago Age-related osteoporosis without current pathological fracture   [facility] [place], Dionne Bucy, MD   10 months ago Encounter for annual wellness visit (AWV) in Medicare patient   [facility], Dionne Bucy, MD       Future Appointments             Tomorrow [place], Dionne Bucy, MD [facility], [place]   In 2 weeks Bacigalupo, Dionne Bucy, MD [facility], [place]

## 2022-05-10 ENCOUNTER — Ambulatory Visit (INDEPENDENT_AMBULATORY_CARE_PROVIDER_SITE_OTHER): Payer: Medicare Other | Admitting: Family Medicine

## 2022-05-10 ENCOUNTER — Encounter: Payer: Self-pay | Admitting: Family Medicine

## 2022-05-10 VITALS — BP 139/79 | HR 73 | Temp 98.0°F | Resp 16 | Wt 116.8 lb

## 2022-05-10 DIAGNOSIS — R21 Rash and other nonspecific skin eruption: Secondary | ICD-10-CM | POA: Diagnosis not present

## 2022-05-10 MED ORDER — PREDNISONE 10 MG PO TABS: ORAL_TABLET | ORAL | 0 refills | Status: DC

## 2022-05-10 MED ORDER — HYDROXYZINE PAMOATE 25 MG PO CAPS: 25.0000 mg | ORAL_CAPSULE | Freq: Three times a day (TID) | ORAL | 1 refills | Status: DC | PRN

## 2022-05-10 NOTE — Progress Notes (Signed)
I,[name],acting as a Education administrator for Lavon Paganini, MD.,have documented all relevant documentation on the behalf of Lavon Paganini, MD,as directed by  Lavon Paganini, MD while in the presence of Lavon Paganini, MD.     Established patient visit   Patient: Catherine Arnold   DOB: April 26, 1951   71 y.o. Female  MRN: [id number] Visit Date: 05/10/2022  Today's healthcare provider: Lavon Paganini, MD   Chief Complaint  Patient presents with   Follow-up   Subjective    HPI  Follow up for itching/rash  The patient was last seen for this 2 weeks ago. Changes made at last visit include start hydroxyzine, continue daily antihistamine. Refer to allergy for evaluation.   She reports excellent compliance with treatment. She feels that condition is Worse. Spreading to her back and legs. She reports pain and itching.  She is not having side effects.   ----------------------------------------------------------------------------------------- Started on legs in November. Looked like chiggers at the time. Tried calamine, lotion, ice, ibuprofen. Didn't work, so thought it might be contact derm from soap - switched all soaps and laundry detergents.  Didn't work. Thought it might be fosamax - stopped it and no improvement. Seemed to be worse the two days following taking fosamax each week. Has held it x2 weeks.  Dr Ky Barban Rx'd prednisone and it got a little better. Came right back after stopping prednisone.  CBC and CMP normal (glucose was high non-fasting). Hydroxyzine helping a little bit. Benadryl not helping.  Rash seems to be spreading. Now covering whole back.  Seeing Derm tomorrow.  Medications: Outpatient Medications Prior to Visit  Medication Sig   acyclovir (ZOVIRAX) 400 MG tablet Take 1 tablet (400 mg total) by mouth 2 (two) times daily. Suppressive therapy   alendronate (FOSAMAX) 70 MG tablet Take 1 tablet (70 mg total) by mouth every 7 (seven) days. Take with a  full glass of water on an empty stomach.   citalopram (CELEXA) 20 MG tablet TAKE 1 AND 1/2 TABLET BY MOUTH DAILY   clonazePAM (KLONOPIN) 1 MG tablet Take 0.5 tablets (0.5 mg total) by mouth at bedtime.   fluticasone (FLONASE) 50 MCG/ACT nasal spray Place 2 sprays into both nostrils daily.   [DISCONTINUED] hydrOXYzine (VISTARIL) 25 MG capsule Take 1 capsule (25 mg total) by mouth every 8 (eight) hours as needed.   No facility-administered medications prior to visit.    Review of Systems  Constitutional:  Negative for appetite change, chills and fatigue.  Respiratory:  Positive for shortness of breath. Negative for chest tightness.   Cardiovascular:  Negative for chest pain.  Gastrointestinal:  Negative for abdominal pain, nausea and vomiting.  Skin:  Positive for color change and rash.       Objective    BP 139/79 (BP Location: Left Arm, Patient Position: Sitting, Cuff Size: Normal)   Pulse 73   Temp 98 F (36.7 C) (Oral)   Resp 16   Wt 116 lb 12.8 oz (53 kg)   BMI 19.44 kg/m    Physical Exam Vitals reviewed.  Constitutional:      General: She is not in acute distress.    Appearance: She is well-developed.  HENT:     Head: Normocephalic and atraumatic.  Eyes:     General: No scleral icterus.    Conjunctiva/sclera: Conjunctivae normal.  Cardiovascular:     Rate and Rhythm: Normal rate and regular rhythm.  Pulmonary:     Effort: Pulmonary effort is normal. No respiratory distress.  Skin:  General: Skin is warm and dry.     Findings: Rash (erythematous, maculopapular, +excoriations, central confluence on back, spares arms and legs) present.  Neurological:     Mental Status: She is alert and oriented to person, place, and time.  Psychiatric:        Behavior: Behavior normal.      No results found for any visits on 05/10/22.  Assessment & Plan     Problem List Items Addressed This Visit       Musculoskeletal and Integument   Rash - Primary    Ongoing problem  x6 weeks or more Appears allergic in nature Consider drug rash May be related to fosamax - with long half life, may need more time to get out of her system Do not think it is related to her history of breast cancer-reassurance given about this Keep dermatology appointment tomorrow, and discussed with patient that they may biopsy it which will give [place] confirmation about exact etiology I do not think that this is a contact dermatitis given that it spares her arms and legs which are covered by the same close She did have improvement with prednisone, but I do not think she was on long enough course I will give her a 20-day taper of prednisone to use instead She is also already referred to allergy and I gave her their contact information to call for an appointment        Return if symptoms worsen or fail to improve.      I, Lavon Paganini, MD, have reviewed all documentation for this visit. The documentation on 05/10/22 for the exam, diagnosis, procedures, and orders are all accurate and complete.   Belynda Pagaduan, Dionne Bucy, MD, MPH [facility]

## 2022-05-10 NOTE — Patient Instructions (Signed)
(  [phone] [id number]

## 2022-05-10 NOTE — Assessment & Plan Note (Signed)
Ongoing problem x6 weeks or more Appears allergic in nature Consider drug rash May be related to fosamax - with long half life, may need more time to get out of her system Do not think it is related to her history of breast cancer-reassurance given about this Keep dermatology appointment tomorrow, and discussed with patient that they may biopsy it which will give [place] confirmation about exact etiology I do not think that this is a contact dermatitis given that it spares her arms and legs which are covered by the same close She did have improvement with prednisone, but I do not think she was on long enough course I will give her a 20-day taper of prednisone to use instead She is also already referred to allergy and I gave her their contact information to call for an appointment

## 2022-05-11 ENCOUNTER — Encounter: Payer: Self-pay | Admitting: Family Medicine

## 2022-05-18 NOTE — Progress Notes (Deleted)
      Established patient visit   Patient: Catherine Arnold   DOB: 1950-10-22   71 y.o. Female  MRN: [id number] Visit Date: 05/24/2022  Today's healthcare provider: Lavon Paganini, MD   No chief complaint on file.  Subjective    HPI  Follow up for rash  The patient was last seen for this 14 days ago. Changes made at last visit include continue prednisone 20 day taper. Keep derm appt.  She reports {excellent/good/fair/poor:19665} compliance with treatment. She feels that condition is {improved/worse/unchanged:3041574}. She {is/is not:21021397} having side effects. ***  -----------------------------------------------------------------------------------------   Medications: Outpatient Medications Prior to Visit  Medication Sig   acyclovir (ZOVIRAX) 400 MG tablet Take 1 tablet (400 mg total) by mouth 2 (two) times daily. Suppressive therapy   alendronate (FOSAMAX) 70 MG tablet Take 1 tablet (70 mg total) by mouth every 7 (seven) days. Take with a full glass of water on an empty stomach.   citalopram (CELEXA) 20 MG tablet TAKE 1 AND 1/2 TABLET BY MOUTH DAILY   clonazePAM (KLONOPIN) 1 MG tablet Take 0.5 tablets (0.5 mg total) by mouth at bedtime.   fluticasone (FLONASE) 50 MCG/ACT nasal spray Place 2 sprays into both nostrils daily.   hydrOXYzine (VISTARIL) 25 MG capsule Take 1 capsule (25 mg total) by mouth every 8 (eight) hours as needed.   predniSONE (DELTASONE) 10 MG tablet Take '40mg'$  PO daily x4d, then '30mg'$  daily x4d, then '20mg'$  daily x4d, then '10mg'$  daily x4 d, then '5mg'$  daily x4d   No facility-administered medications prior to visit.    Review of Systems  Constitutional:  Negative for chills and fever.  Respiratory:  Negative for chest tightness and shortness of breath.   Skin:  Positive for color change and rash.    {Labs  Heme  Chem  Endocrine  Serology  Results Review (optional):23779}   Objective    There were no vitals taken for this visit. BP Readings from  Last 3 Encounters:  05/10/22 139/79  04/25/22 125/73  04/17/22 130/77   Wt Readings from Last 3 Encounters:  05/10/22 116 lb 12.8 oz (53 kg)  04/25/22 114 lb 3.2 oz (51.8 kg)  04/17/22 113 lb 1.6 oz (51.3 kg)      Physical Exam  ***  No results found for any visits on 05/24/22.  Assessment & Plan     ***  No follow-ups on file.      {provider attestation***:1}   Lavon Paganini, MD  [facility] [phone] (phone) [phone] (fax)  [place]

## 2022-05-24 ENCOUNTER — Ambulatory Visit: Payer: Medicare Other | Admitting: Family Medicine

## 2022-05-24 DIAGNOSIS — R21 Rash and other nonspecific skin eruption: Secondary | ICD-10-CM

## 2022-06-04 DIAGNOSIS — S93401A Sprain of unspecified ligament of right ankle, initial encounter: Secondary | ICD-10-CM | POA: Insufficient documentation

## 2022-06-04 DIAGNOSIS — M25571 Pain in right ankle and joints of right foot: Secondary | ICD-10-CM | POA: Insufficient documentation

## 2022-07-03 ENCOUNTER — Encounter: Payer: Self-pay | Admitting: Family Medicine

## 2022-07-03 ENCOUNTER — Ambulatory Visit
Admission: RE | Admit: 2022-07-03 | Discharge: 2022-07-03 | Disposition: A | Discharge status: Home / Self Care | Payer: Medicare Other | Source: Ambulatory Visit | Attending: Family Medicine | Admitting: Family Medicine

## 2022-07-03 ENCOUNTER — Ambulatory Visit (INDEPENDENT_AMBULATORY_CARE_PROVIDER_SITE_OTHER): Payer: Medicare Other | Admitting: Family Medicine

## 2022-07-03 VITALS — BP 138/70 | HR 72 | Temp 98.2°F | Resp 20 | Wt 117.2 lb

## 2022-07-03 DIAGNOSIS — M79669 Pain in unspecified lower leg: Secondary | ICD-10-CM | POA: Insufficient documentation

## 2022-07-03 DIAGNOSIS — I8002 Phlebitis and thrombophlebitis of superficial vessels of left lower extremity: Secondary | ICD-10-CM

## 2022-07-03 NOTE — Progress Notes (Signed)
   I,[name],acting as a Education administrator for Lavon Paganini, MD.,have documented all relevant documentation on the behalf of Lavon Paganini, MD,as directed by  Lavon Paganini, MD while in the presence of Lavon Paganini, MD.     Established patient visit   Patient: Catherine Arnold   DOB: Jan 10, 1951   72 y.o. Female  MRN: [id number] Visit Date: 07/03/2022  Today's healthcare provider: Lavon Paganini, MD   Chief Complaint  Patient presents with   Leg Pain   Subjective    HPI  Patient C/O vein swelling in the left calf x 2 days. She reports mild pain. Patient C/O blood clot.    [place] Jan 15th and hurt R leg  Medications: Outpatient Medications Prior to Visit  Medication Sig   acyclovir (ZOVIRAX) 400 MG tablet Take 1 tablet (400 mg total) by mouth 2 (two) times daily. Suppressive therapy   alendronate (FOSAMAX) 70 MG tablet Take 1 tablet (70 mg total) by mouth every 7 (seven) days. Take with a full glass of water on an empty stomach.   citalopram (CELEXA) 20 MG tablet TAKE 1 AND 1/2 TABLET BY MOUTH DAILY   clonazePAM (KLONOPIN) 1 MG tablet Take 0.5 tablets (0.5 mg total) by mouth at bedtime.   fluticasone (FLONASE) 50 MCG/ACT nasal spray Place 2 sprays into both nostrils daily.   hydrOXYzine (VISTARIL) 25 MG capsule Take 1 capsule (25 mg total) by mouth every 8 (eight) hours as needed.   predniSONE (DELTASONE) 10 MG tablet Take '40mg'$  PO daily x4d, then '30mg'$  daily x4d, then '20mg'$  daily x4d, then '10mg'$  daily x4 d, then '5mg'$  daily x4d (Patient not taking: Reported on 07/03/2022)   No facility-administered medications prior to visit.    Review of Systems per HPI     Objective    BP 138/70 (BP Location: Left Arm, Patient Position: Sitting, Cuff Size: Normal)   Pulse 72   Temp 98.2 F (36.8 C) (Temporal)   Resp 20   Wt 117 lb 3.2 oz (53.2 kg)   BMI 19.50 kg/m    Physical Exam Constitutional:      General: She is not in acute distress.    Appearance: Normal  appearance.  HENT:     Head: Normocephalic.  Pulmonary:     Effort: Pulmonary effort is normal. No respiratory distress.  Musculoskeletal:     Comments: Palpable and visualized superficial clot on L upper calf, +L calf TTP diffusely and positive Homans sign  Skin:    General: Skin is warm.     Findings: No rash.  Neurological:     Mental Status: She is alert and oriented to person, place, and time. Mental status is at baseline.     No results found for any visits on 07/03/22.  Assessment & Plan     1. 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 Calf tenderness - discussed that this is less worrisome and often needs no treatment compared to DVT - we do need to r/o DVT though given calf tenderness and positive Homans sign - stat DVT study - will decide on anticoag pending results - US Venous Img Lower Unilateral Left; Future   Return if symptoms worsen or fail to improve.      I, Lavon Paganini, MD, have reviewed all documentation for this visit. The documentation on 07/03/22 for the exam, diagnosis, procedures, and orders are all accurate and complete.   Dearra Myhand, Dionne Bucy, MD, MPH [facility]

## 2022-07-04 ENCOUNTER — Telehealth: Payer: Self-pay

## 2022-07-04 NOTE — Telephone Encounter (Signed)
Copied from [place]. Topic: Appointment Scheduling - Scheduling Inquiry for Clinic >> Jul 04, 2022  2:57 PM Sabas Sous wrote: Reason for CRM: Pt called wanting to reschedule her [place] appt on Monday

## 2022-07-04 NOTE — Telephone Encounter (Signed)
Appt r/s to 07/24/2022 at 940am

## 2022-07-09 ENCOUNTER — Encounter: Payer: Medicare Other | Admitting: Family Medicine

## 2022-07-10 ENCOUNTER — Encounter: Payer: Self-pay | Admitting: Family Medicine

## 2022-07-24 ENCOUNTER — Ambulatory Visit (INDEPENDENT_AMBULATORY_CARE_PROVIDER_SITE_OTHER): Payer: Medicare Other | Admitting: Family Medicine

## 2022-07-24 ENCOUNTER — Encounter: Payer: Self-pay | Admitting: Family Medicine

## 2022-07-24 VITALS — BP 119/76 | HR 77 | Temp 97.9°F | Resp 20 | Ht 65.5 in | Wt 119.0 lb

## 2022-07-24 DIAGNOSIS — R7303 Prediabetes: Secondary | ICD-10-CM

## 2022-07-24 DIAGNOSIS — F419 Anxiety disorder, unspecified: Secondary | ICD-10-CM

## 2022-07-24 DIAGNOSIS — Z0000 Encounter for general adult medical examination without abnormal findings: Secondary | ICD-10-CM

## 2022-07-24 DIAGNOSIS — Z13220 Encounter for screening for lipoid disorders: Secondary | ICD-10-CM

## 2022-07-24 DIAGNOSIS — Z818 Family history of other mental and behavioral disorders: Secondary | ICD-10-CM | POA: Insufficient documentation

## 2022-07-24 MED ORDER — CLONAZEPAM 1 MG PO TABS: 0.5000 mg | ORAL_TABLET | Freq: Every day | ORAL | 1 refills | Status: DC

## 2022-07-24 NOTE — Assessment & Plan Note (Signed)
Recommend low carb diet °Recheck A1c  °

## 2022-07-24 NOTE — Assessment & Plan Note (Signed)
Chronic and well controlled Continue celexa at current dose Continue low dose klonopin for sleep

## 2022-07-24 NOTE — Assessment & Plan Note (Signed)
H/o dementia in her mother Occasional memory slips (forgetting someone's name or where she is going) MMSE is normal today - will monitor annually Discussed lifestyle changes for memory health

## 2022-07-24 NOTE — Progress Notes (Signed)
Annual Wellness Visit   Patient: Catherine Arnold, Female    DOB: 1950/08/16, 72 y.o.   MRN: [id number]  Subjective  Chief Complaint  Patient presents with   Anxiety   Follow-up    Catherine Arnold is a 72 y.o. female who presents today for her Annual Wellness Visit. She reports consuming a general diet.  She is exercising regularly  She generally feels well. She reports sleeping fairly well. She does not have additional problems to discuss today.    Patient concerned about memory loss. She reports her mother had dementia.   Anxiety, Follow-up  She was last seen for anxiety 1 years ago. Changes made at last visit include no changes continue Celexa and klonopin.   She reports excellent compliance with treatment. She reports excellent tolerance of treatment. She is not having side effects.   She feels her anxiety is mild and Unchanged since last visit.  GAD-7 Results    07/24/2022    9:39 AM 07/04/2020   11:26 AM  GAD-7 Generalized Anxiety Disorder Screening Tool  1. Feeling Nervous, Anxious, or on Edge 0 0  2. Not Being Able to Stop or Control Worrying 0 0  3. Worrying Too Much About Different Things 0 2  4. Trouble Relaxing 0 0  5. Being So Restless it's Hard To Sit Still 0 0  6. Becoming Easily Annoyed or Irritable 0 0  7. Feeling Afraid As If Something Awful Might Happen 0 1  Total GAD-7 Score 0 3  Difficulty At Work, Home, or Getting  Along With Others? Not difficult at all Not difficult at all    PHQ-9 Scores    07/03/2022    3:47 PM 05/10/2022    3:41 PM 01/29/2022   11:45 AM  PHQ9 SCORE ONLY  PHQ-9 Total Score 0 0 2   --------------------------------------------------------------------------------------------------- Patient stopped fosamax due to rash. She reports she is willing to go back on medication. She reports after stopping medication rash and itching did not improve. She reports taking hydroxyzine as needed. She reports improvement.      Medications: Outpatient Medications Prior to Visit  Medication Sig   acyclovir (ZOVIRAX) 400 MG tablet Take 1 tablet (400 mg total) by mouth 2 (two) times daily. Suppressive therapy   citalopram (CELEXA) 20 MG tablet TAKE 1 AND 1/2 TABLET BY MOUTH DAILY   fluticasone (FLONASE) 50 MCG/ACT nasal spray Place 2 sprays into both nostrils daily.   hydrOXYzine (VISTARIL) 25 MG capsule Take 1 capsule (25 mg total) by mouth every 8 (eight) hours as needed.   [DISCONTINUED] clonazePAM (KLONOPIN) 1 MG tablet Take 0.5 tablets (0.5 mg total) by mouth at bedtime.   alendronate (FOSAMAX) 70 MG tablet Take 1 tablet (70 mg total) by mouth every 7 (seven) days. Take with a full glass of water on an empty stomach. (Patient not taking: Reported on 07/24/2022)   [DISCONTINUED] predniSONE (DELTASONE) 10 MG tablet Take '40mg'$  PO daily x4d, then '30mg'$  daily x4d, then '20mg'$  daily x4d, then '10mg'$  daily x4 d, then '5mg'$  daily x4d   No facility-administered medications prior to visit.    Allergies  Allergen Reactions   Bee Venom Anaphylaxis   Paclitaxel Shortness Of Breath and Anaphylaxis    Other reaction(s): ANAPHYLAXIS Other reaction(s): SHORTNESS OF BREATH   Prochlorperazine Maleate Other (See Comments)    Other reaction(s): Unknown reaction   Oxycodone-Acetaminophen     Other reaction(s): NAUSEA   Sulfa Antibiotics     Patient Care Team: Lavon Paganini  M, MD as PCP - General (Family Medicine)  Review of Systems  All other systems reviewed and are negative.      Objective  BP 119/76 (BP Location: Left Arm, Patient Position: Sitting, Cuff Size: Normal)   Pulse 77   Temp 97.9 F (36.6 C) (Temporal)   Resp 20   Ht 5' 5.5" (1.664 m)   Wt 119 lb (54 kg)   SpO2 99%   BMI 19.50 kg/m    Physical Exam Vitals reviewed.  Constitutional:      General: She is not in acute distress.    Appearance: Normal appearance. She is well-developed. She is not diaphoretic.  HENT:     Head: Normocephalic and  atraumatic.  Eyes:     General: No scleral icterus.    Conjunctiva/sclera: Conjunctivae normal.     Pupils: Pupils are equal, round, and reactive to light.  Neck:     Thyroid: No thyromegaly.  Cardiovascular:     Rate and Rhythm: Normal rate and regular rhythm.     Pulses: Normal pulses.     Heart sounds: Normal heart sounds. No murmur heard. Pulmonary:     Effort: Pulmonary effort is normal. No respiratory distress.     Breath sounds: Normal breath sounds. No wheezing or rales.  Abdominal:     General: There is no distension.     Palpations: Abdomen is soft.     Tenderness: There is no abdominal tenderness.  Musculoskeletal:        General: No deformity.     Cervical back: Neck supple.     Right lower leg: No edema.     Left lower leg: No edema.  Lymphadenopathy:     Cervical: No cervical adenopathy.  Skin:    General: Skin is warm and dry.  Neurological:     Mental Status: She is alert and oriented to person, place, and time. Mental status is at baseline.     Gait: Gait normal.  Psychiatric:        Mood and Affect: Mood normal.        Behavior: Behavior normal.        Thought Content: Thought content normal.    Most recent functional status assessment:    07/24/2022    9:53 AM  In your present state of health, do you have any difficulty performing the following activities:  Hearing? 0  Vision? 0  Difficulty concentrating or making decisions? 0  Walking or climbing stairs? 0  Dressing or bathing? 0  Doing errands, shopping? 0   Most recent fall risk assessment:    07/24/2022    9:52 AM  Fall Risk   Falls in the past year? 1  Number falls in past yr: 0  Injury with Fall? 1  Risk for fall due to : History of fall(s)  Follow up Falls evaluation completed    Most recent depression screenings:    07/24/2022    9:53 AM 07/03/2022    3:47 PM  PHQ 2/9 Scores  PHQ - 2 Score 0 0  PHQ- 9 Score 0 0   Most recent cognitive screening:    07/24/2022    9:44 AM  6CIT  Screen  What Year? 0 points  What month? 0 points  What time? 0 points  Count back from 20 0 points  Months in reverse 0 points  Repeat phrase 2 points  Total Score 2 points      07/24/2022   10:41 AM  MMSE -  Mini Mental State Exam  Orientation to time 5  Orientation to Place 5  Registration 3  Attention/ Calculation 5  Recall 3  Language- name 2 objects 2  Language- repeat 1  Language- follow 3 step command 3  Language- read & follow direction 1  Write a sentence 1  Copy design 1  Total score 30     Most recent Audit-C alcohol use screening    07/24/2022    9:53 AM  Alcohol Use Disorder Test (AUDIT)  1. How often do you have a drink containing alcohol? 4  2. How many drinks containing alcohol do you have on a typical day when you are drinking? 0  3. How often do you have six or more drinks on one occasion? 0  AUDIT-C Score 4   A score of 3 or more in women, and 4 or more in men indicates increased risk for alcohol abuse, EXCEPT if all of the points are from question 1   Vision/Hearing Screen: No results found.    No results found for any visits on 07/24/22.    Assessment & Plan   Annual wellness visit done today including the all of the following: Reviewed patient's Family Medical History Reviewed and updated list of patient's medical providers Assessment of cognitive impairment was done Assessed patient's functional ability Established a written schedule for health screening [place] Completed and Reviewed  Exercise Activities and Dietary recommendations  Goals   None     Immunization History  Administered Date(s) Administered   Fluad Quad(high Dose 65+) 01/22/2019, 02/11/2020, 01/25/2021, 02/07/2022   Influenza, High Dose Seasonal PF 03/07/2017, 02/22/2018   Influenza,inj,Quad PF,6+ Mos 03/26/2015   Influenza-Unspecified 02/09/2016   PFIZER Comirnaty(Gray Top)Covid-19 Tri-Sucrose Vaccine 08/24/2020, 02/24/2022   PFIZER(Purple  Top)SARS-COV-2 Vaccination 06/13/2019, 07/04/2019, 03/24/2020   Pneumococcal Conjugate-13 03/07/2017   Pneumococcal Polysaccharide-23 06/11/2018   Rsv, Bivalent, Protein Subunit Rsvpref,pf (Abrysvo) 02/07/2022   Td 06/11/2018   Zoster Recombinat (Shingrix) 06/14/2017, 08/21/2017   Zoster, Live 02/27/2012    Health Maintenance  Topic Date Due   COVID-19 Vaccine (6 - 2023-24 season) 04/21/2022   Fecal DNA (Cologuard)  07/13/2023   Medicare Annual Wellness (AWV)  07/24/2023   DEXA SCAN  10/26/2023   DTaP/Tdap/Td (2 - Tdap) 06/11/2028   Pneumonia Vaccine 42+ Years old  Completed   INFLUENZA VACCINE  Completed   Hepatitis C Screening  Completed   Zoster Vaccines- Shingrix  Completed   HPV VACCINES  Aged Out     Discussed health benefits of physical activity, and encouraged her to engage in regular exercise appropriate for her age and condition.    Problem List Items Addressed This Visit       Other   Anxiety    Chronic and well controlled Continue celexa at current dose Continue low dose klonopin for sleep      Relevant Medications   clonazePAM (KLONOPIN) 1 MG tablet   Prediabetes    Recommend low carb diet Recheck A1c       Relevant Orders   Hemoglobin A1c   Family history of dementia    H/o dementia in her mother Occasional memory slips (forgetting someone's name or where she is going) MMSE is normal today - will monitor annually Discussed lifestyle changes for memory health      Other Visit Diagnoses     Encounter for annual wellness visit (AWV) in Medicare patient    -  Primary   Relevant Orders   Lipid panel  Hemoglobin A1c   Screening for lipid disorders       Relevant Orders   Lipid panel       Return in about 1 year (around 07/24/2023) for AWV.     Lavon Paganini, MD

## 2022-08-03 ENCOUNTER — Encounter: Payer: Self-pay | Admitting: Family Medicine

## 2022-08-03 MED ORDER — HYDROXYZINE PAMOATE 25 MG PO CAPS: 25.0000 mg | ORAL_CAPSULE | Freq: Three times a day (TID) | ORAL | 1 refills | Status: AC | PRN

## 2022-08-05 ENCOUNTER — Other Ambulatory Visit: Payer: Self-pay | Admitting: Family Medicine

## 2022-08-05 DIAGNOSIS — F339 Major depressive disorder, recurrent, unspecified: Secondary | ICD-10-CM

## 2022-08-15 ENCOUNTER — Encounter: Payer: Self-pay | Admitting: Family Medicine

## 2022-08-17 MED ORDER — ALENDRONATE SODIUM 70 MG PO TABS
70.0000 mg | ORAL_TABLET | ORAL | 5 refills | Status: DC
Start: 2022-08-17 — End: 2023-11-25

## 2022-11-07 ENCOUNTER — Other Ambulatory Visit: Payer: Self-pay | Admitting: Family Medicine

## 2022-11-07 ENCOUNTER — Telehealth: Payer: Self-pay | Admitting: Family Medicine

## 2022-11-07 DIAGNOSIS — F339 Major depressive disorder, recurrent, unspecified: Secondary | ICD-10-CM

## 2022-11-07 MED ORDER — CITALOPRAM HYDROBROMIDE 20 MG PO TABS: 30.0000 mg | ORAL_TABLET | Freq: Every day | ORAL | 3 refills | Status: DC

## 2022-11-07 NOTE — Telephone Encounter (Signed)
[facility] said they have been waiting over a week for refill authorizations on Citalopram.  Can you please send this in today??

## 2022-12-14 ENCOUNTER — Encounter: Payer: Self-pay | Admitting: Family Medicine

## 2023-01-20 ENCOUNTER — Other Ambulatory Visit: Payer: Self-pay | Admitting: Family Medicine

## 2023-01-20 DIAGNOSIS — F419 Anxiety disorder, unspecified: Secondary | ICD-10-CM

## 2023-01-30 ENCOUNTER — Other Ambulatory Visit: Payer: Self-pay | Admitting: Family Medicine

## 2023-04-19 ENCOUNTER — Encounter: Payer: Self-pay | Admitting: Family Medicine

## 2023-07-09 ENCOUNTER — Ambulatory Visit (INDEPENDENT_AMBULATORY_CARE_PROVIDER_SITE_OTHER): Payer: Medicare Other | Admitting: Family Medicine

## 2023-07-11 NOTE — Progress Notes (Signed)
 Patient was scheduled too early for AWV - was rescheduled.

## 2023-07-15 ENCOUNTER — Other Ambulatory Visit: Payer: Self-pay | Admitting: Family Medicine

## 2023-07-15 DIAGNOSIS — F419 Anxiety disorder, unspecified: Secondary | ICD-10-CM

## 2023-07-26 ENCOUNTER — Encounter: Payer: Self-pay | Admitting: Family Medicine

## 2023-07-26 ENCOUNTER — Ambulatory Visit
Admission: RE | Admit: 2023-07-26 | Discharge: 2023-07-26 | Disposition: A | Discharge status: Home / Self Care | Source: Ambulatory Visit | Attending: Family Medicine | Admitting: Family Medicine

## 2023-07-26 ENCOUNTER — Ambulatory Visit
Admission: RE | Admit: 2023-07-26 | Discharge: 2023-07-26 | Disposition: A | Discharge status: Home / Self Care | Attending: Family Medicine | Admitting: Family Medicine

## 2023-07-26 ENCOUNTER — Ambulatory Visit (INDEPENDENT_AMBULATORY_CARE_PROVIDER_SITE_OTHER): Payer: Medicare Other | Admitting: Family Medicine

## 2023-07-26 VITALS — BP 124/80 | HR 75 | Ht 65.5 in | Wt 116.9 lb

## 2023-07-26 DIAGNOSIS — E559 Vitamin D deficiency, unspecified: Secondary | ICD-10-CM | POA: Diagnosis not present

## 2023-07-26 DIAGNOSIS — Z1211 Encounter for screening for malignant neoplasm of colon: Secondary | ICD-10-CM

## 2023-07-26 DIAGNOSIS — M79662 Pain in left lower leg: Secondary | ICD-10-CM | POA: Insufficient documentation

## 2023-07-26 DIAGNOSIS — M810 Age-related osteoporosis without current pathological fracture: Secondary | ICD-10-CM

## 2023-07-26 DIAGNOSIS — J301 Allergic rhinitis due to pollen: Secondary | ICD-10-CM

## 2023-07-26 DIAGNOSIS — Z0001 Encounter for general adult medical examination with abnormal findings: Secondary | ICD-10-CM

## 2023-07-26 DIAGNOSIS — Z0000 Encounter for general adult medical examination without abnormal findings: Secondary | ICD-10-CM

## 2023-07-26 DIAGNOSIS — Z853 Personal history of malignant neoplasm of breast: Secondary | ICD-10-CM

## 2023-07-26 DIAGNOSIS — R7303 Prediabetes: Secondary | ICD-10-CM | POA: Diagnosis not present

## 2023-07-26 NOTE — Assessment & Plan Note (Signed)
 Severe hay fever symptoms, including intense ocular pruritus. Symptoms particularly severe this year.

## 2023-07-26 NOTE — Assessment & Plan Note (Signed)
 Last A1c back to normal Continue to monitor A1c

## 2023-07-26 NOTE — Assessment & Plan Note (Signed)
 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

## 2023-07-26 NOTE — Progress Notes (Signed)
 Annual Wellness Visit     Patient: Catherine Arnold, Female    DOB: July 12, 1950, 73 y.o.   MRN: [id number] Visit Date: 07/26/2023  Today's Provider: Shirlee Latch, MD   Chief Complaint  Patient presents with   Annual Exam    Last completed 07/24/22 Diet -   general, well balanced Exercise - at least twice a day by walking dog for over a mile Feeling - well Sleeping - well Concerns - general aging, arthritis pain   Care Management    Cologuard- ok to order Influenza vaccine - received in October   Subjective    Catherine Arnold is a 73 y.o. female who presents today for her Annual Wellness Visit.  Discussed the use of AI scribe software for clinical note transcription with the patient, who gave verbal consent to proceed.  History of Present Illness   The patient, with a past medical history of cancer, osteoporosis, and arthritis, presents with a chief complaint of left leg pain. The pain is described as aching and gnawing, particularly noticeable at night. The pain is located in the shin area of the left leg. The patient reports that the pain tends to subside during the day, especially when she is distracted. The patient is concerned about the possibility of cancer due to her history, but also considers the possibility of shin splints due to her high level of physical activity, which includes a lot of walking and working on a concrete floor. The patient also reports suffering from allergies and hay fever, which have been particularly severe recently.             Medications: Outpatient Medications Prior to Visit  Medication Sig   acyclovir (ZOVIRAX) 400 MG tablet TAKE 1 TABLET BY MOUTH TWICE A DAY FOR SUPPRESSIVE THERAPY   alendronate (FOSAMAX) 70 MG tablet Take 1 tablet (70 mg total) by mouth every 7 (seven) days. Take with a full glass of water on an empty stomach.   citalopram (CELEXA) 20 MG tablet Take 1.5 tablets (30 mg total) by mouth daily.   clonazePAM  (KLONOPIN) 1 MG tablet TAKE 1/2 TABLET BY MOUTH EVERY NIGHT AT BEDTIME   fluticasone (FLONASE) 50 MCG/ACT nasal spray Place 2 sprays into both nostrils daily.   hydrOXYzine (VISTARIL) 25 MG capsule Take 1 capsule (25 mg total) by mouth every 8 (eight) hours as needed.   No facility-administered medications prior to visit.    Allergies  Allergen Reactions   Bee Venom Anaphylaxis   Paclitaxel Shortness Of Breath and Anaphylaxis    Other reaction(s): ANAPHYLAXIS Other reaction(s): SHORTNESS OF BREATH   Prochlorperazine Maleate Other (See Comments)    Other reaction(s): Unknown reaction   Oxycodone-Acetaminophen     Other reaction(s): NAUSEA   Sulfa Antibiotics     Patient Care Team: Erasmo Downer, MD as PCP - General (Family Medicine)  Review of Systems       Objective    Vitals: BP 124/80 (BP Location: Left Arm, Patient Position: Sitting)   Pulse 75   Ht 5' 5.5" (1.664 m)   Wt 116 lb 14.4 oz (53 kg)   SpO2 100%   BMI 19.16 kg/m     Physical Exam Vitals reviewed.  Constitutional:      General: She is not in acute distress.    Appearance: Normal appearance. She is well-developed. She is not diaphoretic.  HENT:     Head: Normocephalic and atraumatic.     Right Ear: Tympanic membrane,  ear canal and external ear normal.     Left Ear: Tympanic membrane, ear canal and external ear normal.     Nose: Nose normal.     Mouth/Throat:     Mouth: Mucous membranes are moist.     Pharynx: Oropharynx is clear. No oropharyngeal exudate.  Eyes:     General: No scleral icterus.    Conjunctiva/sclera: Conjunctivae normal.     Pupils: Pupils are equal, round, and reactive to light.  Neck:     Thyroid: No thyromegaly.  Cardiovascular:     Rate and Rhythm: Normal rate and regular rhythm.     Heart sounds: Normal heart sounds. No murmur heard. Pulmonary:     Effort: Pulmonary effort is normal. No respiratory distress.     Breath sounds: Normal breath sounds. No wheezing or  rales.  Abdominal:     General: There is no distension.     Palpations: Abdomen is soft.     Tenderness: There is no abdominal tenderness.  Musculoskeletal:        General: No deformity.     Cervical back: Neck supple.     Right lower leg: No edema.     Left lower leg: No edema.  Lymphadenopathy:     Cervical: No cervical adenopathy.  Skin:    General: Skin is warm and dry.     Findings: No rash.  Neurological:     Mental Status: She is alert and oriented to person, place, and time. Mental status is at baseline.     Gait: Gait normal.  Psychiatric:        Mood and Affect: Mood normal.        Behavior: Behavior normal.        Thought Content: Thought content normal.     Most recent functional status assessment:    07/26/2023    9:26 AM  In your present state of health, do you have any difficulty performing the following activities:  Hearing? 0  Vision? 0  Difficulty concentrating or making decisions? 0  Walking or climbing stairs? 0  Dressing or bathing? 0  Doing errands, shopping? 0  Preparing Food and eating ? N  Using the Toilet? N  In the past six months, have you accidently leaked urine? N  Do you have problems with loss of bowel control? N  Managing your Medications? N  Managing your Finances? N  Housekeeping or managing your Housekeeping? N   Most recent fall risk assessment:    07/26/2023    9:28 AM  Fall Risk   Falls in the past year? 1  Number falls in past yr: 0  Injury with Fall? 1  Risk for fall due to : History of fall(s)  Follow up Falls evaluation completed    Most recent depression screenings:    07/26/2023    9:29 AM 07/24/2022    9:53 AM  PHQ 2/9 Scores  PHQ - 2 Score 0 0  PHQ- 9 Score 0 0   Most recent cognitive screening:    07/24/2022    9:44 AM  6CIT Screen  What Year? 0 points  What month? 0 points  What time? 0 points  Count back from 20 0 points  Months in reverse 0 points  Repeat phrase 2 points  Total Score 2 points   Most  recent Audit-C alcohol use screening    07/05/2023    3:14 PM  Alcohol Use Disorder Test (AUDIT)  1. How often do you have  a drink containing alcohol? 4  2. How many drinks containing alcohol do you have on a typical day when you are drinking? 0  3. How often do you have six or more drinks on one occasion? 0  AUDIT-C Score 4   4. How often during the last year have you found that you were not able to stop drinking once you had started? 0  5. How often during the last year have you failed to do what was normally expected from you because of drinking? 0  6. How often during the last year have you needed a first drink in the morning to get yourself going after a heavy drinking session? 0  7. How often during the last year have you had a feeling of guilt of remorse after drinking? 0  8. How often during the last year have you been unable to remember what happened the night before because you had been drinking? 0  9. Have you or someone else been injured as a result of your drinking? 0  10. Has a relative or friend or a doctor or another health worker been concerned about your drinking or suggested you cut down? 0  Alcohol Use Disorder Identification Test Final Score (AUDIT) 4      Patient-reported   A score of 3 or more in women, and 4 or more in men indicates increased risk for alcohol abuse, EXCEPT if all of the points are from question 1   No results found.  No results found for any visits on 07/26/23.  Assessment & Plan     Annual wellness visit done today including the all of the following: Reviewed patient's Family Medical History Reviewed and updated list of patient's medical providers Assessment of cognitive impairment was done Assessed patient's functional ability Established a written schedule for health screening services Health Risk Assessent Completed and Reviewed  Exercise Activities and Dietary recommendations  Goals   None     Immunization History  Administered  Date(s) Administered   Fluad Quad(high Dose 65+) 01/22/2019, 02/11/2020, 01/25/2021, 02/07/2022   Influenza, High Dose Seasonal PF 03/07/2017, 02/22/2018   Influenza,inj,Quad PF,6+ Mos 03/26/2015   Influenza-Unspecified 02/09/2016, 02/25/2023   PFIZER Comirnaty(Gray Top)Covid-19 Tri-Sucrose Vaccine 08/24/2020, 02/24/2022   PFIZER(Purple Top)SARS-COV-2 Vaccination 06/13/2019, 07/04/2019, 03/24/2020   Pneumococcal Conjugate-13 03/07/2017   Pneumococcal Polysaccharide-23 06/11/2018   Rsv, Bivalent, Protein Subunit Rsvpref,pf (Abrysvo) 02/07/2022   Td 06/11/2018   Zoster Recombinant(Shingrix) 06/14/2017, 08/21/2017   Zoster, Live 02/27/2012    Health Maintenance  Topic Date Due   COVID-19 Vaccine (6 - 2024-25 season) 01/20/2023   Fecal DNA (Cologuard)  07/13/2023   DEXA SCAN  10/26/2023   Medicare Annual Wellness (AWV)  07/25/2024   DTaP/Tdap/Td (2 - Tdap) 06/11/2028   Pneumonia Vaccine 15+ Years old  Completed   INFLUENZA VACCINE  Completed   Hepatitis C Screening  Completed   Zoster Vaccines- Shingrix  Completed   HPV VACCINES  Aged Out     Discussed health benefits of physical activity, and encouraged her to engage in regular exercise appropriate for her age and condition.    Problem List Items Addressed This Visit       Respiratory   Allergic rhinitis   Severe hay fever symptoms, including intense ocular pruritus. Symptoms particularly severe this year.        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 Continue to remain physically active Continue Ca supplement and continue  Vit D Not currently on Fosamax Repeat DEXA in 3 months      Relevant Orders   DG Bone Density     Other   H/O malignant neoplasm of female breast   Relevant Orders   CBC w/Diff/Platelet   Avitaminosis D   Relevant Orders   VITAMIN D 25 Hydroxy (Vit-D Deficiency, Fractures)   Prediabetes   Last A1c back to normal Continue to  monitor A1c      Relevant Orders   Hemoglobin A1c   Other Visit Diagnoses       Encounter for annual wellness visit (AWV) in Medicare patient    -  Primary   Relevant Orders   Comprehensive metabolic panel   CBC w/Diff/Platelet   Lipid panel     Screening for colon cancer       Relevant Orders   Cologuard     Pain in left shin       Relevant Orders   DG Tibia/Fibula Left           Leg Pain Aching pain in the left shin, particularly at night, unresponsive to ibuprofen. Differential includes muscle strain or tendonitis, possibly due to excessive walking and standing on concrete floors. Given her cancer history, an x-ray is warranted to rule out metastasis. Discussed that x-ray is noninvasive and will help rule out serious conditions, providing peace of mind. - Order x-ray of the left tibia - Advise ice, massage, and daily stretching of the affected muscle  Rotator Cuff Tear Three-quarter tear of the right rotator cuff. Patient opted against surgery due to prolonged recovery and personal circumstances. Discussed pros and cons of surgery, including a six-month recovery period and inability to drive a manual transmission car during recovery.  General Health Maintenance Up to date on RSV, COVID-19, flu, and shingles vaccinations. Tetanus not due until 2030. Due for Cologuard test and bone density scan in June. - Send Cologuard test to home - Schedule bone density scan for June - Order routine labs: A1c, kidney and liver function, blood counts, cholesterol, and vitamin D  Follow-up - Follow up in one year or sooner if needed.       Return in about 1 year (around 07/25/2024) for AWV.     Shirlee Latch, MD  [facility] [phone] (phone) [phone] (fax)  [facility]

## 2023-07-27 LAB — COMPREHENSIVE METABOLIC PANEL
ALT: 9 IU/L (ref 0–32)
AST: 20 IU/L (ref 0–40)
Albumin: 4.8 g/dL (ref 3.8–4.8)
Alkaline Phosphatase: 42 IU/L — ABNORMAL LOW (ref 44–121)
BUN/Creatinine Ratio: 13 (ref 12–28)
BUN: 11 mg/dL (ref 8–27)
Bilirubin Total: 0.4 mg/dL (ref 0.0–1.2)
CO2: 24 mmol/L (ref 20–29)
Calcium: 9.8 mg/dL (ref 8.7–10.3)
Chloride: 98 mmol/L (ref 96–106)
Creatinine, Ser: 0.83 mg/dL (ref 0.57–1.00)
Globulin, Total: 2.2 g/dL (ref 1.5–4.5)
Glucose: 92 mg/dL (ref 70–99)
Potassium: 5.5 mmol/L — ABNORMAL HIGH (ref 3.5–5.2)
Sodium: 137 mmol/L (ref 134–144)
Total Protein: 7 g/dL (ref 6.0–8.5)
eGFR: 75 mL/min/{1.73_m2} (ref >59)

## 2023-07-27 LAB — CBC WITH DIFFERENTIAL/PLATELET
Basophils Absolute: 0 10*3/uL (ref 0.0–0.2)
Basos: 1 %
EOS (ABSOLUTE): 0.1 10*3/uL (ref 0.0–0.4)
Eos: 2 %
Hematocrit: 38.5 % (ref 34.0–46.6)
Hemoglobin: 12.8 g/dL (ref 11.1–15.9)
Immature Grans (Abs): 0 10*3/uL (ref 0.0–0.1)
Immature Granulocytes: 0 %
Lymphocytes Absolute: 1.7 10*3/uL (ref 0.7–3.1)
Lymphs: 31 %
MCH: 30.8 pg (ref 26.6–33.0)
MCHC: 33.2 g/dL (ref 31.5–35.7)
MCV: 93 fL (ref 79–97)
Monocytes Absolute: 0.6 10*3/uL (ref 0.1–0.9)
Monocytes: 11 %
Neutrophils Absolute: 3.1 10*3/uL (ref 1.4–7.0)
Neutrophils: 55 %
Platelets: 325 10*3/uL (ref 150–450)
RBC: 4.15 x10E6/uL (ref 3.77–5.28)
RDW: 12.7 % (ref 11.7–15.4)
WBC: 5.4 10*3/uL (ref 3.4–10.8)

## 2023-07-27 LAB — LIPID PANEL
Chol/HDL Ratio: 2.1 ratio (ref 0.0–4.4)
Cholesterol, Total: 199 mg/dL (ref 100–199)
HDL: 97 mg/dL (ref >39)
LDL Chol Calc (NIH): 92 mg/dL (ref 0–99)
Triglycerides: 54 mg/dL (ref 0–149)
VLDL Cholesterol Cal: 10 mg/dL (ref 5–40)

## 2023-07-27 LAB — HEMOGLOBIN A1C
Est. average glucose Bld gHb Est-mCnc: 120 mg/dL
Hgb A1c MFr Bld: 5.8 % — ABNORMAL HIGH (ref 4.8–5.6)

## 2023-07-27 LAB — VITAMIN D 25 HYDROXY (VIT D DEFICIENCY, FRACTURES): Vit D, 25-Hydroxy: 38.3 ng/mL (ref 30.0–100.0)

## 2023-07-29 ENCOUNTER — Other Ambulatory Visit: Payer: Self-pay

## 2023-07-29 ENCOUNTER — Encounter: Payer: Self-pay | Admitting: Family Medicine

## 2023-07-29 DIAGNOSIS — E875 Hyperkalemia: Secondary | ICD-10-CM

## 2023-07-30 ENCOUNTER — Encounter: Payer: Self-pay | Admitting: Family Medicine

## 2023-08-08 ENCOUNTER — Other Ambulatory Visit: Payer: Self-pay

## 2023-08-08 ENCOUNTER — Telehealth: Payer: Self-pay | Admitting: Emergency Medicine

## 2023-08-08 ENCOUNTER — Ambulatory Visit
Admission: RE | Admit: 2023-08-08 | Discharge: 2023-08-08 | Disposition: A | Discharge status: Home / Self Care | Source: Ambulatory Visit | Attending: Emergency Medicine | Admitting: Emergency Medicine

## 2023-08-08 VITALS — BP 143/87 | HR 68 | Temp 97.7°F | Resp 18

## 2023-08-08 DIAGNOSIS — R300 Dysuria: Secondary | ICD-10-CM | POA: Insufficient documentation

## 2023-08-08 DIAGNOSIS — N898 Other specified noninflammatory disorders of vagina: Secondary | ICD-10-CM | POA: Diagnosis not present

## 2023-08-08 LAB — POCT URINALYSIS DIP (MANUAL ENTRY)
Bilirubin, UA: NEGATIVE
Blood, UA: NEGATIVE
Glucose, UA: NEGATIVE mg/dL
Ketones, POC UA: NEGATIVE mg/dL
Leukocytes, UA: NEGATIVE
Nitrite, UA: NEGATIVE
Protein Ur, POC: NEGATIVE mg/dL
Spec Grav, UA: 1.01
Urobilinogen, UA: 0.2 U/dL
pH, UA: 5.5

## 2023-08-08 MED ORDER — CEPHALEXIN 500 MG PO CAPS: 500.0000 mg | ORAL_CAPSULE | Freq: Three times a day (TID) | ORAL | 0 refills | Status: DC

## 2023-08-08 MED ORDER — CEPHALEXIN 500 MG PO CAPS: 500.0000 mg | ORAL_CAPSULE | Freq: Three times a day (TID) | ORAL | 0 refills | Status: AC

## 2023-08-08 NOTE — ED Triage Notes (Signed)
 Patient presents to [facility] for evaluation of 4-5 days of vaginal irritation.  Took Diflucan for possible yeast infection, did not help.  Patient says she is concerned for UTI.  Also is prediabetic.  C/o burning with urination, but irritation consistently as well.  Felt feverish, but no fever, mild back and abdominal pain

## 2023-08-08 NOTE — Discharge Instructions (Addendum)
 Your evaluated for your vaginal irritation  Urinalysis is negative for any signs of infection but has been sent to lab for 3 days to see if bacteria will grow, you will be notified if this occurs  Begin cephalexin every 8 hours for 5 days  Lab results pending for 2 to 3 days, you will be notified of positive test results only  Will treat you prophylactically again for yeast while we are waiting for your test results checking for yeast and bacterial vaginosis which are infections that occur when the pH is altered  Avoid scented vaginal products, wipe from front to back and increase your fluid intake to further flush out the kidneys and bladder  If your symptoms continue to persist please follow-up for reevaluation

## 2023-08-08 NOTE — ED Provider Notes (Signed)
 Catherine Arnold    CSN: [id number] Arrival date & time: 08/08/23  1338      History   Chief Complaint Chief Complaint  Patient presents with   URI    Pain, inflamed vaginal wall, not feeling well, frequent urination, feverish. - Entered by patient    HPI Catherine Arnold is a 73 y.o. female.   Patient presents for evaluation of vaginal irritation, itching, dysuria, frequency and lower back pain present for 5 days.  Has begun to feel feverish, subjective.  Took 1 Diflucan this morning, has not taken effect.  Denies abdominal pain, urgency, hematuria, vaginal odor or discharge.  Past Medical History:  Diagnosis Date   Allergy Childhood   Hayfever and seasonal   Arthritis 2 to 3 years ago   Sciatica right and left, fingers   Blood transfusion without reported diagnosis 2010   During cancer treatment   Cancer (HCC)    Neuromuscular disorder (HCC) 1 year ago   Pinched nerve in right neck area   Osteoporosis 2023    Patient Active Problem List   Diagnosis Date Noted   Family history of dementia 07/24/2022   Rash 04/25/2022   Prediabetes 07/04/2020   Underweight 01/22/2019   Arthralgia of right temporomandibular joint 01/22/2019   Allergic rhinitis 02/01/2015   H/O malignant neoplasm of female breast 02/01/2015   Herpes labialis 02/01/2015   H/O alcohol abuse 02/01/2015   Osteoporosis 02/01/2015   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      Home Medications    Prior to Admission medications   Medication Sig Start Date End Date Taking? Authorizing Provider  acyclovir (ZOVIRAX) 400 MG tablet  TAKE 1 TABLET BY MOUTH TWICE A DAY FOR SUPPRESSIVE THERAPY 01/31/23  Yes Bacigalupo, Marzella Schlein, MD  cephALEXin (KEFLEX) 500 MG capsule Take 1 capsule (500 mg total) by mouth 3 (three) times daily for 5 days. 08/08/23 08/13/23 Yes Zachary Nole, Elita Boone, NP  alendronate (FOSAMAX) 70 MG tablet Take 1 tablet (70 mg total) by mouth every 7 (seven) days. Take with a full glass of water on an empty stomach. 08/17/22   Bacigalupo, Marzella Schlein, MD  citalopram (CELEXA) 20 MG tablet Take 1.5 tablets (30 mg total) by mouth daily. 11/07/22   Sherlyn Hay, DO  clonazePAM (KLONOPIN) 1 MG tablet TAKE 1/2 TABLET BY MOUTH EVERY NIGHT AT BEDTIME 07/15/23   Erasmo Downer, MD  fluticasone [facility]) 50 MCG/ACT nasal spray Place 2 sprays into both nostrils daily. 02/13/21   Erasmo Downer, MD  hydrOXYzine (VISTARIL) 25 MG capsule Take 1 capsule (25 mg total) by mouth every 8 (eight) hours as needed. 08/03/22   Bacigalupo, Marzella Schlein, MD    Family History Family History  Problem Relation Age of Onset   Dementia Mother    Arthritis Mother    Vision loss Mother    Alcohol abuse Father    Asthma Father    Early death Father    Varicose Veins Father    Seizures Sister    Anxiety disorder Maternal Grandmother  Depression Maternal Grandmother    Early death Maternal Aunt     Social History Social History   Tobacco Use   Smoking status: Former    Current packs/day: 0.00    Average packs/day: 1 pack/day for 15.0 years (15.0 ttl pk-yrs)    Types: Cigarettes    Quit date: 01/20/1992    Years since quitting: 31.5   Smokeless tobacco: Never  Vaping Use   Vaping status: Never Used  Substance Use Topics   Alcohol use: Yes    Alcohol/week: 12.0 standard drinks of alcohol    Types: 12 Cans of beer per week   Drug use: Never     Allergies   Bee venom, Paclitaxel, Prochlorperazine maleate, Oxycodone-acetaminophen, and Sulfa antibiotics   Review of Systems Review of Systems   Physical Exam Triage Vital  Signs ED Triage Vitals  Encounter Vitals Group     BP 08/08/23 1352 (!) 143/87     Systolic BP Percentile --      Diastolic BP Percentile --      Pulse Rate 08/08/23 1352 68     Resp 08/08/23 1352 18     Temp 08/08/23 1352 97.7 F (36.5 C)     Temp Source 08/08/23 1352 Oral     SpO2 08/08/23 1352 99 %     Weight --      Height --      Head Circumference --      Peak Flow --      Pain Score 08/08/23 1356 4     Pain Loc --      Pain Education --      Exclude from Growth Chart --    No data found.  Updated Vital Signs BP (!) 143/87 (BP Location: Left Arm)   Pulse 68   Temp 97.7 F (36.5 C) (Oral)   Resp 18   SpO2 99%   Visual Acuity Right Eye Distance:   Left Eye Distance:   Bilateral Distance:    Right Eye Near:   Left Eye Near:    Bilateral Near:     Physical Exam Constitutional:      Appearance: Normal appearance.  Eyes:     Extraocular Movements: Extraocular movements intact.  Pulmonary:     Effort: Pulmonary effort is normal.  Abdominal:     Tenderness: There is abdominal tenderness in the suprapubic area. There is left CVA tenderness. There is no right CVA tenderness.  Genitourinary:    Comments: deferred Neurological:     Mental Status: She is alert and oriented to person, place, and time. Mental status is at baseline.      UC Treatments / Results  Labs (all labs ordered are listed, but only abnormal results are displayed) Labs Reviewed  POCT URINALYSIS DIP (MANUAL ENTRY) - Normal  URINE CULTURE  CERVICOVAGINAL ANCILLARY ONLY    EKG   Radiology No results found.  Procedures Procedures (including critical care time)  Medications Ordered in UC Medications - No data to display  Initial Impression / Assessment and Plan / UC Course  I have reviewed the triage vital signs and the nursing notes.  Pertinent labs & imaging results that were available during my care of the patient were reviewed by me and considered in my medical decision  making (see chart for details).  Vaginal irritation, dysuria  Urinalysis negative, sent for culture, took Diflucan this morning, prophylactically initiating antibiotics that she is symptomatic, prescribed cephalexin, vaginal swab checking for yeast and BV pending, recommended  additional supportive care with follow-up as needed Final Clinical Impressions(s) / UC Diagnoses   Final diagnoses:  Vaginal irritation  Dysuria     Discharge Instructions      Your evaluated for your vaginal irritation  Urinalysis is negative for any signs of infection but has been sent to lab for 3 days to see if bacteria will grow, you will be notified if this occurs  Begin cephalexin every 8 hours for 5 days  Lab results pending for 2 to 3 days, you will be notified of positive test results only  Will treat you prophylactically again for yeast while we are waiting for your test results checking for yeast and bacterial vaginosis which are infections that occur when the pH is altered  Avoid scented vaginal products, wipe from front to back and increase your fluid intake to further flush out the kidneys and bladder  If your symptoms continue to persist please follow-up for reevaluation   ED Prescriptions     Medication Sig Dispense Auth. Provider   cephALEXin (KEFLEX) 500 MG capsule Take 1 capsule (500 mg total) by mouth 3 (three) times daily for 5 days. 15 capsule Eschol Auxier, Elita Boone, NP      PDMP not reviewed this encounter.   Valinda Hoar, NP 08/08/23 1450

## 2023-08-08 NOTE — Telephone Encounter (Signed)
Patient requested a different pharmacy, resent ?

## 2023-08-09 LAB — CERVICOVAGINAL ANCILLARY ONLY
Bacterial Vaginitis (gardnerella): NEGATIVE
Candida Glabrata: NEGATIVE
Candida Vaginitis: NEGATIVE
Comment: NEGATIVE
Comment: NEGATIVE
Comment: NEGATIVE

## 2023-08-10 LAB — COLOGUARD: COLOGUARD: NEGATIVE

## 2023-08-10 LAB — URINE CULTURE: Culture: <10000 — AB

## 2023-08-13 ENCOUNTER — Encounter: Payer: Self-pay | Admitting: Family Medicine

## 2023-08-13 ENCOUNTER — Encounter: Payer: Self-pay | Admitting: Physician Assistant

## 2023-10-10 ENCOUNTER — Ambulatory Visit: Admitting: Podiatry

## 2023-11-04 ENCOUNTER — Other Ambulatory Visit: Payer: Self-pay

## 2023-11-04 ENCOUNTER — Telehealth: Payer: Self-pay | Admitting: Family Medicine

## 2023-11-04 DIAGNOSIS — M722 Plantar fascial fibromatosis: Secondary | ICD-10-CM | POA: Insufficient documentation

## 2023-11-04 DIAGNOSIS — F339 Major depressive disorder, recurrent, unspecified: Secondary | ICD-10-CM

## 2023-11-04 MED ORDER — CITALOPRAM HYDROBROMIDE 20 MG PO TABS: 30.0000 mg | ORAL_TABLET | Freq: Every day | ORAL | 3 refills | Status: AC

## 2023-11-04 NOTE — Telephone Encounter (Signed)
[facility] pharmacy faxed refill request for the following medications:   citalopram (CELEXA) 20 MG tablet   Please advise  

## 2023-11-19 ENCOUNTER — Ambulatory Visit (INDEPENDENT_AMBULATORY_CARE_PROVIDER_SITE_OTHER): Admitting: Podiatry

## 2023-11-19 DIAGNOSIS — M722 Plantar fascial fibromatosis: Secondary | ICD-10-CM | POA: Diagnosis not present

## 2023-11-19 DIAGNOSIS — M62462 Contracture of muscle, left lower leg: Secondary | ICD-10-CM

## 2023-11-19 NOTE — Progress Notes (Unsigned)
 Subjective:  Patient ID: Catherine Arnold, female    DOB: 1951-04-17,  MRN: [id number]  Chief Complaint  Patient presents with   Foot Pain    Heel pain     73 y.o. female presents with the above complaint.  Patient presents with left heel pain that has been off for quite some time is progressive gotten worse worse with ambulation is with pressure she has not seen MRIs prior to seeing me denies any other acute complaints would like to discuss treatment options.  Pain scale 7 out of 10 dull aching nature   Review of Systems: Negative except as noted in the HPI. Denies N/V/F/Ch.  Past Medical History:  Diagnosis Date   Allergy Childhood   Hayfever and seasonal   Arthritis 2 to 3 years ago   Sciatica right and left, fingers   Blood transfusion without reported diagnosis 2010   During cancer treatment   Cancer (HCC)    Neuromuscular disorder (HCC) 1 year ago   Pinched nerve in right neck area   Osteoporosis 2023    Current Outpatient Medications:    acyclovir  (ZOVIRAX ) 400 MG tablet, TAKE 1 TABLET BY MOUTH TWICE A DAY FOR SUPPRESSIVE THERAPY, Disp: 180 tablet, Rfl: 3   alendronate  (FOSAMAX ) 70 MG tablet, Take 1 tablet (70 mg total) by mouth every 7 (seven) days. Take with a full glass of water on an empty stomach., Disp: 12 tablet, Rfl: 5   citalopram  (CELEXA ) 20 MG tablet, Take 1.5 tablets (30 mg total) by mouth daily., Disp: 135 tablet, Rfl: 3   clonazePAM  (KLONOPIN ) 1 MG tablet, TAKE 1/2 TABLET BY MOUTH EVERY NIGHT AT BEDTIME, Disp: 45 tablet, Rfl: 1   fluticasone  (FLONASE ) 50 MCG/ACT nasal spray, Place 2 sprays into both nostrils daily., Disp: 48 g, Rfl: 3   hydrOXYzine  (VISTARIL ) 25 MG capsule, Take 1 capsule (25 mg total) by mouth every 8 (eight) hours as needed., Disp: 30 capsule, Rfl: 1  Social History   Tobacco Use  Smoking Status Former   Current packs/day: 0.00   Average packs/day: 1 pack/day for 15.0 years (15.0 ttl pk-yrs)   Types: Cigarettes   Quit date: 01/20/1992    Years since quitting: 31.8  Smokeless Tobacco Never    Allergies  Allergen Reactions   Bee Venom Anaphylaxis   Paclitaxel Shortness Of Breath and Anaphylaxis    Other reaction(s): ANAPHYLAXIS Other reaction(s): SHORTNESS OF BREATH   Prochlorperazine Maleate Other (See Comments)    Other reaction(s): Unknown reaction   Oxycodone-Acetaminophen     Other reaction(s): NAUSEA   Sulfa Antibiotics    Objective:  There were no vitals filed for this visit. There is no height or weight on file to calculate BMI. Constitutional Well developed. Well nourished.  Vascular Dorsalis pedis pulses palpable bilaterally. Posterior tibial pulses palpable bilaterally. Capillary refill normal to all digits.  No cyanosis or clubbing noted. Pedal hair growth normal.  Neurologic Normal speech. Oriented to person, place, and time. Epicritic sensation to light touch grossly present bilaterally.  Dermatologic Nails well groomed and normal in appearance. No open wounds. No skin lesions.  Orthopedic: Normal joint ROM without pain or crepitus bilaterally. No visible deformities. Tender to palpation at the calcaneal tuber left. No pain with calcaneal squeeze left. Ankle ROM diminished range of motion left. Silfverskiold Test: positive left.   Radiographs: None  Assessment:  No diagnosis found. Plan:  Patient was evaluated and treated and all questions answered.  Plantar Fasciitis, left with underlying gastrocnemius equinus -  XR reviewed as above.  - Educated on icing and stretching. Instructions given.  - Injection delivered to the plantar fascia as below. - DME: Plantar fascial brace dispensed to support the medial longitudinal arch of the foot and offload pressure from the heel and prevent arch collapse during weightbearing - Pharmacologic management: None  Procedure: Injection Tendon/Ligament Location: Left plantar fascia at the glabrous junction; medial approach. Skin Prep:  alcohol Injectate: 0.5 cc 0.5% marcaine plain, 0.5 cc of 1% Lidocaine , 0.5 cc kenalog 10. Disposition: Patient tolerated procedure well. Injection site dressed with a band-aid.  No follow-ups on file.

## 2023-11-21 ENCOUNTER — Other Ambulatory Visit: Payer: Self-pay | Admitting: Family Medicine

## 2023-12-19 ENCOUNTER — Ambulatory Visit: Admitting: Podiatry

## 2024-01-21 ENCOUNTER — Other Ambulatory Visit: Payer: Self-pay | Admitting: Family Medicine

## 2024-01-21 ENCOUNTER — Encounter: Payer: Self-pay | Admitting: Family Medicine

## 2024-01-21 DIAGNOSIS — F419 Anxiety disorder, unspecified: Secondary | ICD-10-CM

## 2024-01-22 ENCOUNTER — Other Ambulatory Visit: Payer: Self-pay

## 2024-01-22 MED ORDER — CETIRIZINE HCL 10 MG PO TABS: 10.0000 mg | ORAL_TABLET | Freq: Every day | ORAL | 3 refills | Status: AC

## 2024-02-20 ENCOUNTER — Other Ambulatory Visit: Payer: Self-pay | Admitting: Family Medicine

## 2024-03-16 ENCOUNTER — Ambulatory Visit
Admission: RE | Admit: 2024-03-16 | Discharge: 2024-03-16 | Disposition: A | Discharge status: Home / Self Care | Source: Ambulatory Visit | Attending: Family Medicine | Admitting: Family Medicine

## 2024-03-16 ENCOUNTER — Ambulatory Visit: Payer: Self-pay | Admitting: Family Medicine

## 2024-03-16 ENCOUNTER — Encounter: Payer: Self-pay | Admitting: Family Medicine

## 2024-03-16 DIAGNOSIS — M810 Age-related osteoporosis without current pathological fracture: Secondary | ICD-10-CM | POA: Diagnosis present

## 2024-03-16 MED ORDER — FLUCONAZOLE 150 MG PO TABS: 150.0000 mg | ORAL_TABLET | Freq: Once | ORAL | 2 refills | Status: AC

## 2024-04-15 ENCOUNTER — Encounter: Payer: Self-pay | Admitting: Family Medicine

## 2024-04-15 ENCOUNTER — Encounter: Payer: Self-pay | Admitting: Emergency Medicine

## 2024-04-15 ENCOUNTER — Ambulatory Visit
Admission: EM | Admit: 2024-04-15 | Discharge: 2024-04-15 | Disposition: A | Discharge status: Home / Self Care | Attending: Emergency Medicine | Admitting: Emergency Medicine

## 2024-04-15 DIAGNOSIS — R350 Frequency of micturition: Secondary | ICD-10-CM | POA: Insufficient documentation

## 2024-04-15 LAB — POCT URINE DIPSTICK
Bilirubin, UA: NEGATIVE
Blood, UA: NEGATIVE
Glucose, UA: NEGATIVE mg/dL
Ketones, POC UA: NEGATIVE mg/dL
Leukocytes, UA: NEGATIVE
Nitrite, UA: NEGATIVE
POC PROTEIN,UA: NEGATIVE
Spec Grav, UA: 1.015 (ref 1.010–1.025)
Urobilinogen, UA: 0.2 U/dL
pH, UA: 5.5 (ref 5.0–8.0)

## 2024-04-15 MED ORDER — CIPROFLOXACIN HCL 500 MG PO TABS: 500.0000 mg | ORAL_TABLET | Freq: Two times a day (BID) | ORAL | 0 refills | Status: AC

## 2024-04-15 NOTE — ED Provider Notes (Signed)
 Catherine Arnold    CSN: [id number] Arrival date & time: 04/15/24  1606      History   Chief Complaint Chief Complaint  Patient presents with   Dysuria   Fatigue    HPI Catherine Arnold is a 73 y.o. female.   Patient presents for evaluation of fatigue, chills, urinary frequency and dysuria beginning 2 days ago.  Has been experiencing mild vaginal itching and vaginal irritation as well, took Diflucan  1 day prior.  Additionally has attempted Azo and ibuprofen Profen with minimal relief.  Denies abdominal or flank pain, vaginal discharge or odor.  Past Medical History:  Diagnosis Date   Allergy Childhood   Hayfever and seasonal   Arthritis 2 to 3 years ago   Sciatica right and left, fingers   Blood transfusion without reported diagnosis 2010   During cancer treatment   Cancer (HCC)    Neuromuscular disorder (HCC) 1 year ago   Pinched nerve in right neck area   Osteoporosis 2023    Patient Active Problem List   Diagnosis Date Noted   Family history of dementia 07/24/2022   Rash 04/25/2022   Prediabetes 07/04/2020   Underweight 01/22/2019   Arthralgia of right temporomandibular joint 01/22/2019   Allergic rhinitis 02/01/2015   H/O malignant neoplasm of female breast 02/01/2015   Herpes labialis 02/01/2015   H/O alcohol abuse 02/01/2015   Osteoporosis 02/01/2015   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      Home Medications    Prior to Admission medications   Medication Sig Start Date End Date Taking? Authorizing Provider  acyclovir  (ZOVIRAX ) 400 MG tablet TAKE 1 TABLET BY MOUTH 2 TIMES A DAY  FOR SUPPRESSIVE THERAPY 02/24/24   Myrla Jon HERO, MD  alendronate  (FOSAMAX ) 70 MG tablet TAKE 1 TABLET BY MOUTH ONCE WEEKLY ON AN EMPTY STOMACH BEFORE BREAKFAST. REMAIN UPRIGHT FOR 30 MINUTES AND TAKE WITH 8 OUNCES OF WATER 11/25/23   Gasper Nancyann BRAVO, MD  cetirizine  (ZYRTEC ) 10 MG tablet Take 1 tablet (10 mg total) by mouth daily. 01/22/24   Bacigalupo, Angela M, MD  citalopram  (CELEXA ) 20 MG tablet Take 1.5 tablets (30 mg total) by mouth daily. 11/04/23   Myrla Jon HERO, MD  clonazePAM  (KLONOPIN ) 1 MG tablet TAKE A HALF TABLET BY MOUTH BY MOUTH AT BEDTIME 01/23/24   Bacigalupo, Jon HERO, MD  fluticasone  (FLONASE ) 50 MCG/ACT nasal spray Place 2 sprays into both nostrils daily. 02/13/21   Bacigalupo, Angela M, MD  hydrOXYzine  (VISTARIL ) 25 MG capsule Take 1 capsule (25 mg total) by mouth every 8 (eight) hours as needed. 08/03/22   Bacigalupo, Jon HERO, MD    Family History Family History  Problem Relation Age of Onset   Dementia Mother    Arthritis Mother    Vision loss Mother    Alcohol abuse Father    Asthma Father    Early death Father    Varicose Veins Father    Seizures Sister    Anxiety disorder Maternal Grandmother    Depression Maternal Grandmother  Early death Maternal Aunt     Social History Social History   Tobacco Use   Smoking status: Former    Current packs/day: 0.00    Average packs/day: 1 pack/day for 15.0 years (15.0 ttl pk-yrs)    Types: Cigarettes    Quit date: 01/20/1992    Years since quitting: 32.2   Smokeless tobacco: Never  Vaping Use   Vaping status: Never Used  Substance Use Topics   Alcohol use: Yes    Alcohol/week: 12.0 standard drinks of alcohol    Types: 12 Cans of beer per week   Drug use: Never     Allergies   Bee venom, Paclitaxel, Prochlorperazine maleate, Oxycodone-acetaminophen, and Sulfa antibiotics   Review of Systems Review of Systems   Physical Exam Triage Vital Signs ED Triage Vitals  Encounter Vitals Group     BP  04/15/24 1617 133/78     Girls Systolic BP Percentile --      Girls Diastolic BP Percentile --      Boys Systolic BP Percentile --      Boys Diastolic BP Percentile --      Pulse Rate 04/15/24 1617 67     Resp 04/15/24 1617 18     Temp 04/15/24 1617 (!) 97.4 F (36.3 C)     Temp Source 04/15/24 1617 Oral     SpO2 04/15/24 1617 99 %     Weight --      Height --      Head Circumference --      Peak Flow --      Pain Score 04/15/24 1614 0     Pain Loc --      Pain Education --      Exclude from Growth Chart --    No data found.  Updated Vital Signs BP 133/78 (BP Location: Left Arm)   Pulse 67   Temp (!) 97.4 F (36.3 C) (Oral)   Resp 18   SpO2 99%   Visual Acuity Right Eye Distance:   Left Eye Distance:   Bilateral Distance:    Right Eye Near:   Left Eye Near:    Bilateral Near:     Physical Exam Constitutional:      Appearance: Normal appearance.  Eyes:     Extraocular Movements: Extraocular movements intact.  Pulmonary:     Effort: Pulmonary effort is normal.  Abdominal:     Tenderness: There is abdominal tenderness in the suprapubic area. There is no right CVA tenderness or left CVA tenderness.  Neurological:     Mental Status: She is alert and oriented to person, place, and time. Mental status is at baseline.      UC Treatments / Results  Labs (all labs ordered are listed, but only abnormal results are displayed) Labs Reviewed  URINE CULTURE  POCT URINE DIPSTICK    EKG   Radiology No results found.  Procedures Procedures (including critical care time)  Medications Ordered in UC Medications - No data to display  Initial Impression / Assessment and Plan / UC Course  I have reviewed the triage vital signs and the nursing notes.  Pertinent labs & imaging results that were available during my care of the patient were reviewed by me and considered in my medical decision making (see chart for details).  Urinary frequency  Urinalysis negative,  sent for culture, empirically placed on ciprofloxacin  as she is symptomatic with strong history of UTI, prior culture showing E. coli, recommend over-the-counter medications and  nonpharmacological supportive care, deferring testing for yeast and bacterial vaginosis as patient is already treated symptom of vaginal itching, recommended follow-up as needed Final Clinical Impressions(s) / UC Diagnoses   Final diagnoses:  Urinary frequency   Discharge Instructions   None    ED Prescriptions   None    PDMP not reviewed this encounter.   Teresa Shelba SAUNDERS, NP 04/15/24 [phone]

## 2024-04-15 NOTE — Telephone Encounter (Signed)
 Original encounter sent to Dr.Fisher Patient also sent one other of Azo test strip. Message sent advising office visit needed

## 2024-04-15 NOTE — ED Triage Notes (Signed)
 Patient complains of painful urination and fatigue x 2 days. Patient has been taking AZO  and ibuprofen with mild relief.

## 2024-04-15 NOTE — Discharge Instructions (Addendum)
 Your urinalysis not show signs of infection, your urine will be sent to the lab to determine exactly which bacteria is present, if any changes need to be made to your medications you will be notified  Begin use of Ciprofloxacin  twice a day for 5 days as you are symptomatic and have a strong history of bladder infection  You may use over-the-counter Azo to help minimize your symptoms until antibiotic removes bacteria, this medication will turn your urine orange  Increase your fluid intake through use of water  As always practice good hygiene, wiping front to back and avoidance of scented vaginal products to prevent further irritation  If symptoms continue to persist after use of medication or recur please follow-up with urgent care or your primary doctor as needed'

## 2024-04-16 LAB — URINE CULTURE: Culture: NO GROWTH

## 2024-04-17 ENCOUNTER — Ambulatory Visit (HOSPITAL_COMMUNITY): Payer: Self-pay

## 2024-04-20 ENCOUNTER — Ambulatory Visit: Admitting: General Practice

## 2024-04-20 NOTE — Telephone Encounter (Signed)
 Can you find her an appt please? Let's check for UTI, discharge, atrophy, etc

## 2024-04-28 ENCOUNTER — Ambulatory Visit

## 2024-04-28 ENCOUNTER — Other Ambulatory Visit: Payer: Self-pay | Admitting: Podiatry

## 2024-04-28 ENCOUNTER — Ambulatory Visit: Admitting: Podiatry

## 2024-04-28 DIAGNOSIS — M21619 Bunion of unspecified foot: Secondary | ICD-10-CM

## 2024-04-28 DIAGNOSIS — M205X2 Other deformities of toe(s) (acquired), left foot: Secondary | ICD-10-CM | POA: Diagnosis not present

## 2024-04-28 NOTE — Progress Notes (Unsigned)
 Subjective:  Patient ID: Catherine Arnold, female    DOB: 11/07/1950,  MRN: [id number]  Chief Complaint  Patient presents with   Bunions    Left foot bunion causing discomfort     73 y.o. female presents with the above complaint.  Patient presents with complaint of left history of bunion surgery now with underlying arthritis.  She states it causes a lot of discomfort painful to touch painful to walk on pain scale 7 out of 10 dull achy in nature hurts with ambulation and shoe pressure she wanted discuss surgical options she has tried shoe gear modification padding protecting offloading none of which has helped.  She denies any other acute complaints pain scale 7 out of 10 dull aching nature   Review of Systems: Negative except as noted in the HPI. Denies N/V/F/Ch.  Past Medical History:  Diagnosis Date   Allergy Childhood   Hayfever and seasonal   Arthritis 2 to 3 years ago   Sciatica right and left, fingers   Blood transfusion without reported diagnosis 2010   During cancer treatment   Cancer (HCC)    Neuromuscular disorder (HCC) 1 year ago   Pinched nerve in right neck area   Osteoporosis 2023   Current Medications[1]  Tobacco Use History[2]  Allergies[3] Objective:  There were no vitals filed for this visit. There is no height or weight on file to calculate BMI. Constitutional Well developed. Well nourished.  Vascular Dorsalis pedis pulses palpable bilaterally. Posterior tibial pulses palpable bilaterally. Capillary refill normal to all digits.  No cyanosis or clubbing noted. Pedal hair growth normal.  Neurologic Normal speech. Oriented to person, place, and time. Epicritic sensation to light touch grossly present bilaterally.  Dermatologic Nails well groomed and normal in appearance. No open wounds. No skin lesions.  Orthopedic: Pain on palpation to the left first metatarsophalangeal joint Limited range of motion noted.  Pain at the end range of motion.  Clinically  able to appreciate some crepitus and arthritic changes.  History of bunion surgery noted.   Radiographs: 3 views of skeletally mature adult left foot:Previous order noted with long K wire into the first metatarsophalangeal joint and second digit Weil osteotomy with screw fixation no signs of backing out or loosening noted. Assessment:   1. Hallux limitus, left    Plan:  Patient was evaluated and treated and all questions answered.  Left hallux limitus with a history of bunion surgery and some arthritis - All questions or concerns were discussed with the patient in extensive detail given the presence of limitus with underlying arthritis patient would benefit from silicone toe replacement in the future she states understanding will do so immediately if her pain worsens.  For now she will think about the procedures and will get back to me.  No follow-ups on file.   First MTP hallux limitus mild arthritis history of previous bunion surgery discussed replacement in the future    [1]  Current Outpatient Medications:    fluconazole  (DIFLUCAN ) 150 MG tablet, Take 150 mg by mouth every 3 (three) days., Disp: , Rfl:    acyclovir  (ZOVIRAX ) 400 MG tablet, TAKE 1 TABLET BY MOUTH 2 TIMES A DAY FOR SUPPRESSIVE THERAPY, Disp: 180 tablet, Rfl: 3   cetirizine  (ZYRTEC ) 10 MG tablet, Take 1 tablet (10 mg total) by mouth daily., Disp: 90 tablet, Rfl: 3   citalopram  (CELEXA ) 20 MG tablet, Take 1.5 tablets (30 mg total) by mouth daily., Disp: 135 tablet, Rfl: 3   clonazePAM  (KLONOPIN )  1 MG tablet, TAKE A HALF TABLET BY MOUTH BY MOUTH AT BEDTIME, Disp: 45 tablet, Rfl: 1   fluticasone  (FLONASE ) 50 MCG/ACT nasal spray, Place 2 sprays into both nostrils daily., Disp: 48 g, Rfl: 3   hydrOXYzine  (VISTARIL ) 25 MG capsule, Take 1 capsule (25 mg total) by mouth every 8 (eight) hours as needed., Disp: 30 capsule, Rfl: 1 [2]  Social History Tobacco Use  Smoking Status Former   Current packs/day: 0.00   Average  packs/day: 1 pack/day for 15.0 years (15.0 ttl pk-yrs)   Types: Cigarettes   Quit date: 01/20/1992   Years since quitting: 32.2  Smokeless Tobacco Never  [3]  Allergies Allergen Reactions   Bee Venom Anaphylaxis   Paclitaxel Shortness Of Breath and Anaphylaxis    Other reaction(s): ANAPHYLAXIS Other reaction(s): SHORTNESS OF BREATH   Paclitaxel (Protein-Bound) Shortness Of Breath    paclitaxel protein-bound   Prochlorperazine Maleate Other (See Comments)    Other reaction(s): Unknown reaction   Oxycodone-Acetaminophen     Other reaction(s): NAUSEA   Oxycodone-Acetaminophen     Other Reaction(s): Not available  acetaminophen / oxycodone   Sulfa Antibiotics

## 2024-05-05 ENCOUNTER — Ambulatory Visit: Admitting: Podiatry

## 2024-07-27 ENCOUNTER — Encounter: Admitting: Family Medicine
